# Patient Record
Sex: Male | Born: 1965
Health system: Southern US, Community
[De-identification: ages and names within clinical notes are randomized; demographics above are authoritative.]

## PROBLEM LIST (undated history)

## (undated) DIAGNOSIS — K219 Gastro-esophageal reflux disease without esophagitis: Secondary | ICD-10-CM

## (undated) DIAGNOSIS — M1990 Unspecified osteoarthritis, unspecified site: Secondary | ICD-10-CM

## (undated) DIAGNOSIS — S8990XA Unspecified injury of unspecified lower leg, initial encounter: Secondary | ICD-10-CM

## (undated) HISTORY — PX: KNEE ARTHROSCOPY: SUR90

## (undated) HISTORY — DX: Unspecified injury of unspecified lower leg, initial encounter: S89.90XA

## (undated) HISTORY — PX: ELBOW SURGERY: SHX618

## (undated) HISTORY — PX: OTHER SURGICAL HISTORY: SHX169

## (undated) HISTORY — PX: KNEE SURGERY: SHX244

---

## 2017-09-02 LAB — COLOGUARD: Cologuard: NEGATIVE

## 2020-07-29 ENCOUNTER — Encounter (HOSPITAL_BASED_OUTPATIENT_CLINIC_OR_DEPARTMENT_OTHER): Payer: Self-pay

## 2020-07-29 ENCOUNTER — Emergency Department (HOSPITAL_BASED_OUTPATIENT_CLINIC_OR_DEPARTMENT_OTHER): Payer: BC Managed Care – PPO | Admitting: Radiology

## 2020-07-29 ENCOUNTER — Other Ambulatory Visit: Payer: Self-pay

## 2020-07-29 ENCOUNTER — Emergency Department (HOSPITAL_BASED_OUTPATIENT_CLINIC_OR_DEPARTMENT_OTHER)
Admission: EM | Admit: 2020-07-29 | Discharge: 2020-07-29 | Disposition: A | Discharge status: Home / Self Care | Payer: BC Managed Care – PPO | Attending: Emergency Medicine | Admitting: Emergency Medicine

## 2020-07-29 DIAGNOSIS — M1711 Unilateral primary osteoarthritis, right knee: Secondary | ICD-10-CM | POA: Diagnosis not present

## 2020-07-29 DIAGNOSIS — M25561 Pain in right knee: Secondary | ICD-10-CM | POA: Diagnosis not present

## 2020-07-29 HISTORY — DX: Unspecified osteoarthritis, unspecified site: M19.90

## 2020-07-29 IMAGING — DX DG KNEE COMPLETE 4+V*R*
1 series · 4 of 4 positions shown · non-contrast
Comparison: None.

CLINICAL DATA: Right medial knee pain for 1.5 months.

EXAM:
RIGHT KNEE - COMPLETE 4+ VIEW

[Series 1: knee · 0.14mm/px · 4 of 4 slices shown]
[im 1/4]
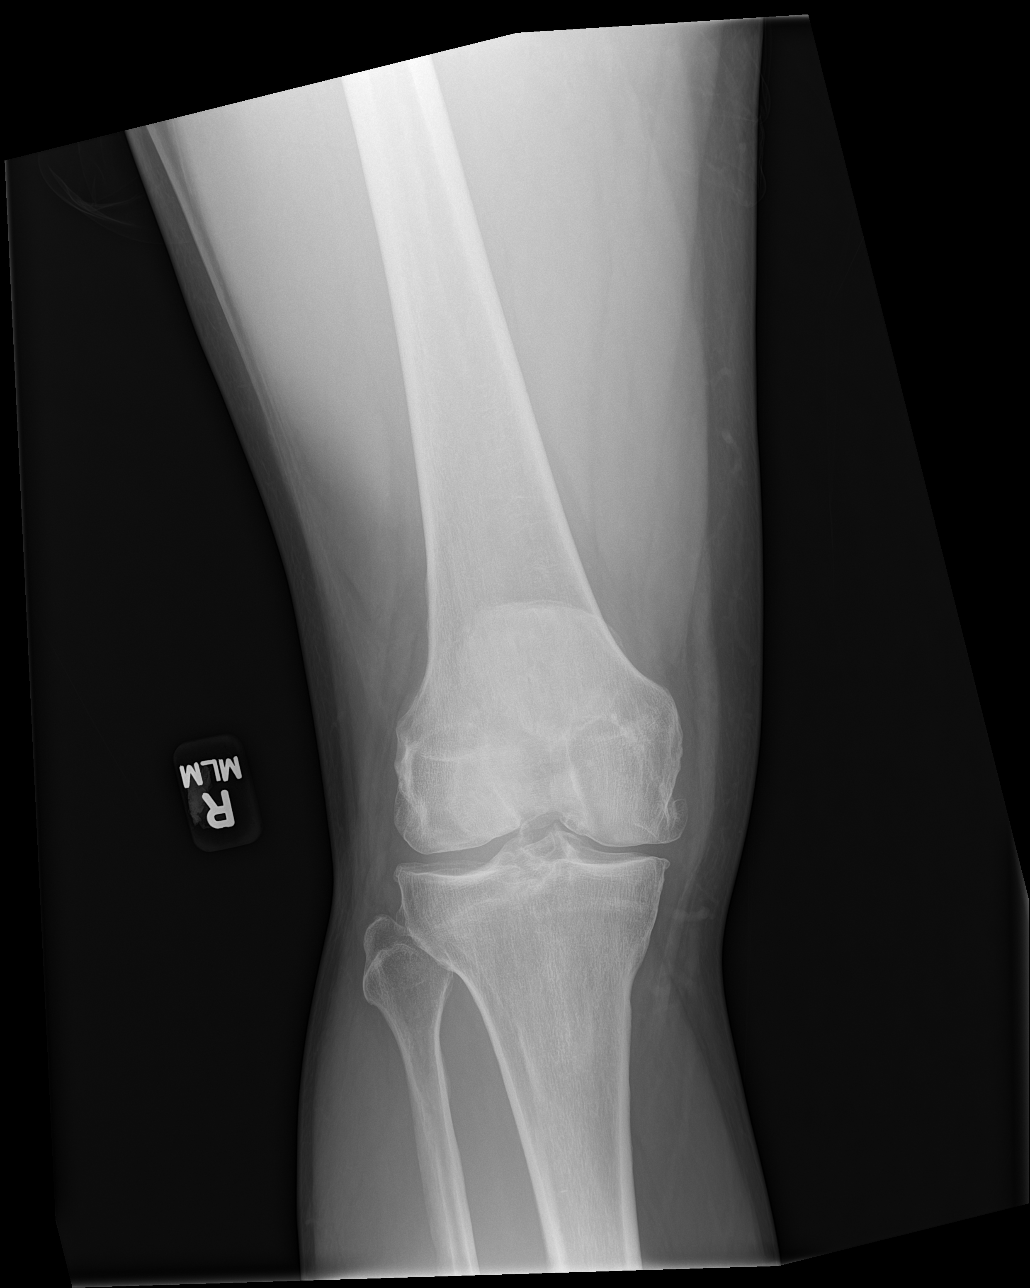
[im 2/4]
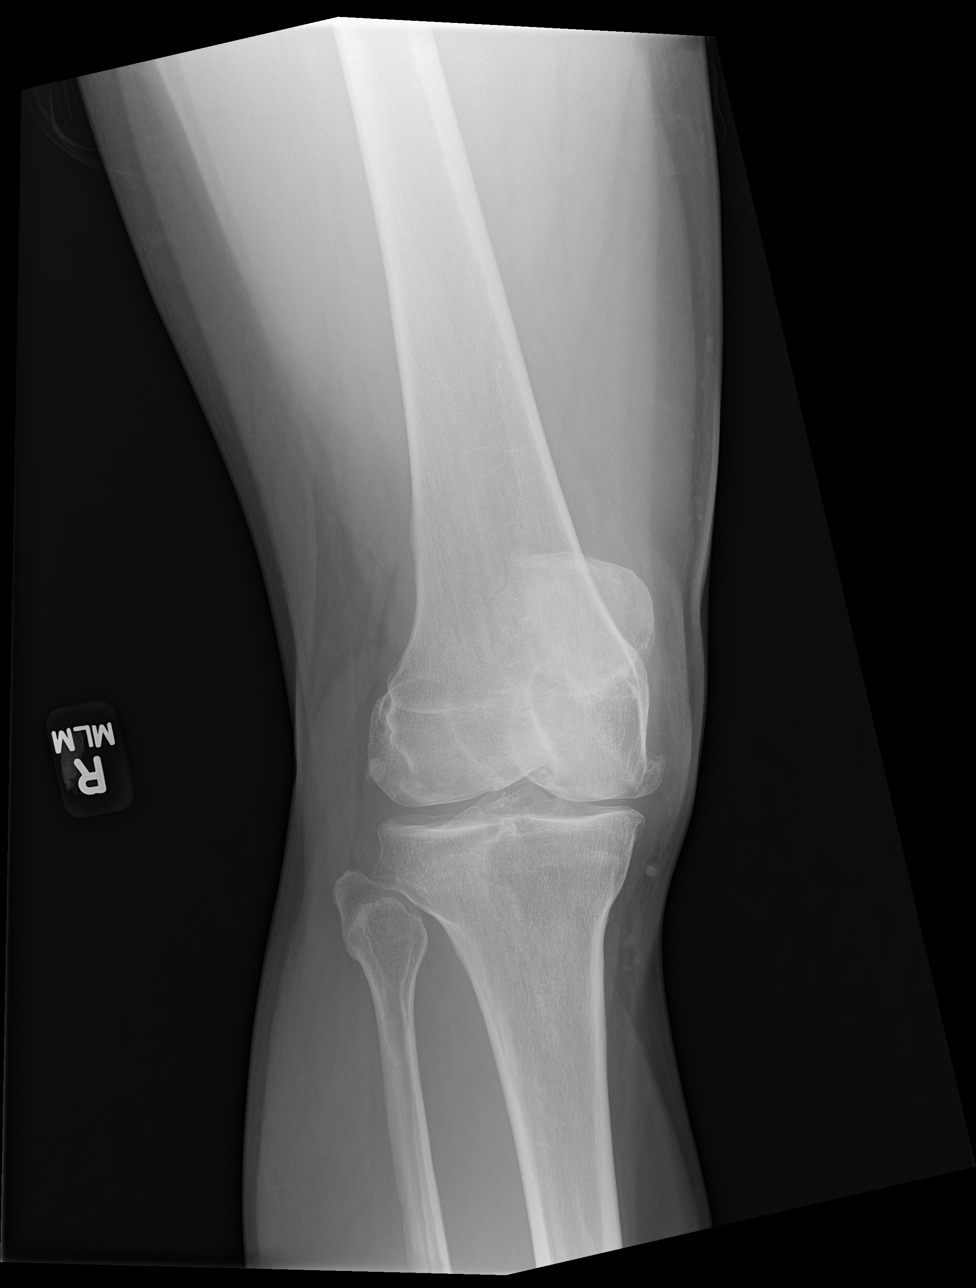
[im 3/4]
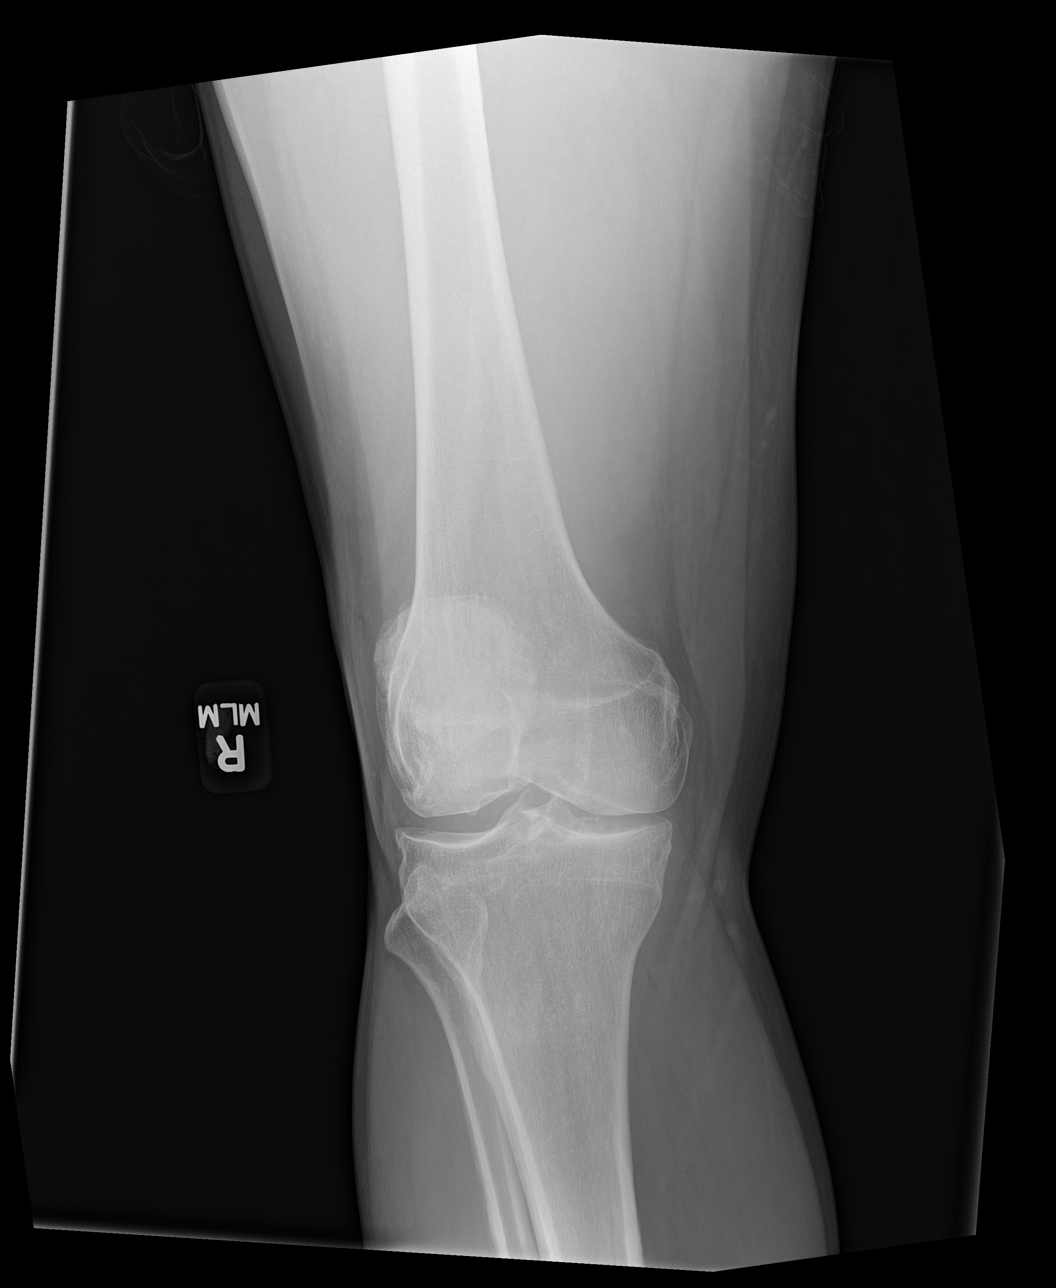
[im 4/4]
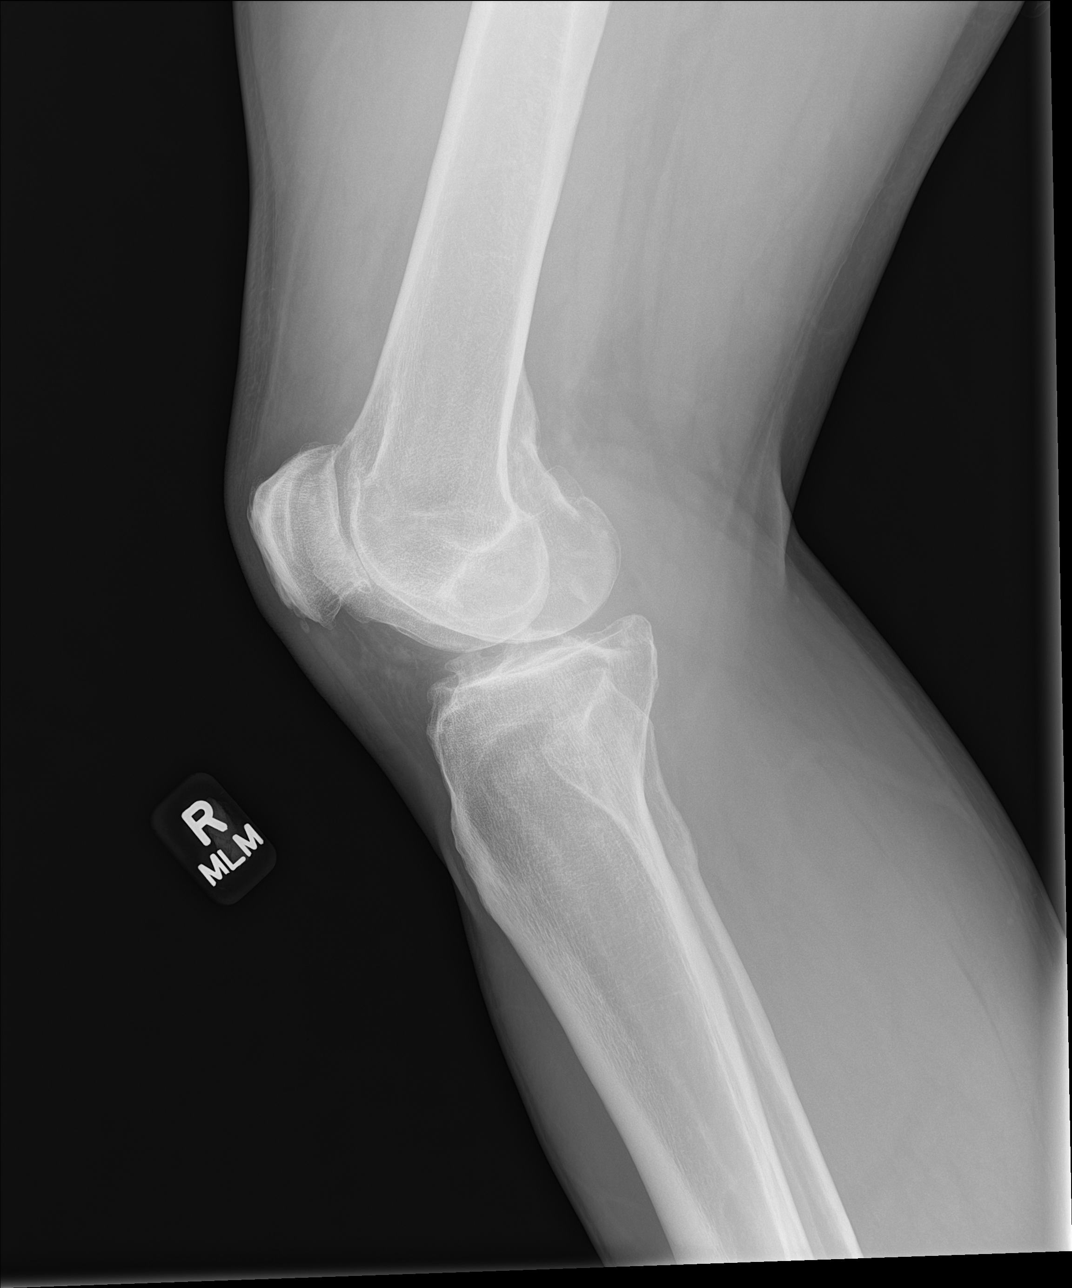

[4 of 4 positions shown; findings below may reference images not displayed]

FINDINGS: Degenerative marginal spurring especially at the medial compartment.
Patellofemoral compartment narrowing is likely, although no sunrise
view. Medial compartment narrowing on the frontal view. No acute
fracture or subluxation. No evidence of erosion. No convincing joint
effusion
IMPRESSION: Tricompartmental osteoarthritis with probable medial and
patellofemoral compartment narrowing.

## 2020-07-29 MED ORDER — KETOROLAC TROMETHAMINE 30 MG/ML IJ SOLN
30.0000 mg | Freq: Once | INTRAMUSCULAR | Status: AC
  Administered 2020-07-29: 30 mg via INTRAMUSCULAR
  Filled 2020-07-29: qty 1

## 2020-07-29 MED ORDER — CELECOXIB 200 MG PO CAPS: 200.0000 mg | ORAL_CAPSULE | Freq: Two times a day (BID) | ORAL | 0 refills | Status: DC

## 2020-07-29 MED ORDER — MELOXICAM 15 MG PO TABS: 15.0000 mg | ORAL_TABLET | Freq: Every day | ORAL | 0 refills | Status: DC

## 2020-07-29 MED ORDER — DEXAMETHASONE SODIUM PHOSPHATE 10 MG/ML IJ SOLN
10.0000 mg | Freq: Once | INTRAMUSCULAR | Status: AC
  Administered 2020-07-29: 10 mg via INTRAMUSCULAR
  Filled 2020-07-29: qty 1

## 2020-07-29 MED ORDER — PREDNISONE 10 MG (21) PO TBPK: ORAL_TABLET | Freq: Every day | ORAL | 0 refills | Status: DC

## 2020-07-29 NOTE — ED Notes (Signed)
ED Provider at bedside. 

## 2020-07-29 NOTE — ED Notes (Signed)
X-ray at bedside

## 2020-07-29 NOTE — ED Triage Notes (Signed)
Pt reports right medial knee pain for approximately 1 1/2 months.  Denies any injury.  Has tried rest, ice, elevation and anti-inflammatory medications without significant relief.

## 2020-07-29 NOTE — ED Provider Notes (Signed)
[facility] Provider Note   CSN: [id number] Arrival date & time: 07/29/20  3149     History Chief Complaint  Patient presents with   Knee Pain    Thomas Small is a 55 y.o. male.  Pt presents to the ED today with right knee pain.  Pt said he has had right knee pain for about 6 weeks.  No known injury.  He said it is getting worse instead of better.  No f/c.  He has been trying ice, rest, nsaids without improvement in sx.        Past Medical History:  Diagnosis Date   Arthritis     There are no problems to display for this patient.   Past Surgical History:  Procedure Laterality Date   KNEE ARTHROSCOPY Bilateral    thumb surgery Right        No family history on file.  Social History   Tobacco Use   Smoking status: Never  Vaping Use   Vaping Use: Never used  Substance Use Topics   Alcohol use: Yes   Drug use: Never    Home Medications Prior to Admission medications   Medication Sig Start Date End Date Taking? Authorizing Provider  celecoxib (CELEBREX) 200 MG capsule Take 1 capsule (200 mg total) by mouth 2 (two) times daily. 07/29/20  Yes Isla Pence, MD  predniSONE (STERAPRED UNI-PAK 21 TAB) 10 MG (21) TBPK tablet Take by mouth daily. Take 6 tabs by mouth daily  for 2 days, then 5 tabs for 2 days, then 4 tabs for 2 days, then 3 tabs for 2 days, 2 tabs for 2 days, then 1 tab by mouth daily for 2 days 07/29/20  Yes Isla Pence, MD    Allergies    Patient has no known allergies.  Review of Systems   Review of Systems  Musculoskeletal:        Right knee pain  All other systems reviewed and are negative.  Physical Exam Updated Vital Signs BP (!) 153/99   Pulse (!) 55   Temp 98.4 F (36.9 C) (Oral)   Resp 16   Ht 6\' 3"  (1.905 m)   Wt 113.4 kg   SpO2 98%   BMI 31.25 kg/m   Physical Exam Vitals and nursing note reviewed.  Constitutional:      Appearance: Normal appearance.  HENT:     Head: Normocephalic and  atraumatic.     Right Ear: External ear normal.     Left Ear: External ear normal.     Nose: Nose normal.     Mouth/Throat:     Mouth: Mucous membranes are moist.     Pharynx: Oropharynx is clear.  Eyes:     Extraocular Movements: Extraocular movements intact.     Conjunctiva/sclera: Conjunctivae normal.     Pupils: Pupils are equal, round, and reactive to light.  Cardiovascular:     Rate and Rhythm: Normal rate and regular rhythm.     Pulses: Normal pulses.     Heart sounds: Normal heart sounds.  Pulmonary:     Effort: Pulmonary effort is normal.     Breath sounds: Normal breath sounds.  Abdominal:     General: Abdomen is flat. Bowel sounds are normal.     Palpations: Abdomen is soft.  Musculoskeletal:     Cervical back: Normal range of motion and neck supple.     Right knee: Crepitus present. Tenderness present. Normal pulse.     Instability Tests: Anterior  drawer test negative. Posterior drawer test negative. Anterior Lachman test negative. Medial McMurray test positive. Lateral McMurray test negative.  Skin:    General: Skin is warm.     Capillary Refill: Capillary refill takes less than 2 seconds.  Neurological:     General: No focal deficit present.     Mental Status: He is alert and oriented to person, place, and time.  Psychiatric:        Mood and Affect: Mood normal.        Behavior: Behavior normal.    ED Results / Procedures / Treatments   Labs (all labs ordered are listed, but only abnormal results are displayed) Labs Reviewed - No data to display  EKG None  Radiology DG Knee Complete 4 Views Right  Result Date: 07/29/2020 CLINICAL DATA:  Right medial knee pain for 1.5 months. EXAM: RIGHT KNEE - COMPLETE 4+ VIEW COMPARISON:  None. FINDINGS: Degenerative marginal spurring especially at the medial compartment. Patellofemoral compartment narrowing is likely, although no sunrise view. Medial compartment narrowing on the frontal view. No acute fracture or  subluxation. No evidence of erosion. No convincing joint effusion IMPRESSION: Tricompartmental osteoarthritis with probable medial and patellofemoral compartment narrowing. Electronically Signed   By: Monte Fantasia M.D.   On: 07/29/2020 09:19    Procedures Procedures   Medications Ordered in ED Medications  ketorolac (TORADOL) 30 MG/ML injection 30 mg (30 mg Intramuscular Given 07/29/20 0859)  dexamethasone (DECADRON) injection 10 mg (10 mg Intramuscular Given 07/29/20 8242)    ED Course  I have reviewed the triage vital signs and the nursing notes.  Pertinent labs & imaging results that were available during my care of the patient were reviewed by me and considered in my medical decision making (see chart for details).    MDM Rules/Calculators/A&P                          Pt with arthritis of knee.  Medial is worse than lateral.  Pt is to f/u with ortho.  Return if worse. Final Clinical Impression(s) / ED Diagnoses Final diagnoses:  Osteoarthritis of right knee, unspecified osteoarthritis type    Rx / DC Orders ED Discharge Orders          Ordered    predniSONE (STERAPRED UNI-PAK 21 TAB) 10 MG (21) TBPK tablet  Daily        07/29/20 0910    celecoxib (CELEBREX) 200 MG capsule  2 times daily        07/29/20 0929             Isla Pence, MD 07/29/20 0930

## 2020-08-06 DIAGNOSIS — M1711 Unilateral primary osteoarthritis, right knee: Secondary | ICD-10-CM | POA: Diagnosis not present

## 2020-10-24 ENCOUNTER — Other Ambulatory Visit: Payer: Self-pay

## 2020-10-24 ENCOUNTER — Encounter: Payer: Self-pay | Admitting: Internal Medicine

## 2020-10-24 ENCOUNTER — Ambulatory Visit: Payer: BC Managed Care – PPO | Admitting: Internal Medicine

## 2020-10-24 VITALS — BP 142/92 | HR 70 | Temp 97.9°F | Resp 16 | Ht 75.0 in | Wt 251.0 lb

## 2020-10-24 DIAGNOSIS — R001 Bradycardia, unspecified: Secondary | ICD-10-CM

## 2020-10-24 DIAGNOSIS — Z125 Encounter for screening for malignant neoplasm of prostate: Secondary | ICD-10-CM | POA: Diagnosis not present

## 2020-10-24 DIAGNOSIS — R030 Elevated blood-pressure reading, without diagnosis of hypertension: Secondary | ICD-10-CM

## 2020-10-24 DIAGNOSIS — Z0001 Encounter for general adult medical examination with abnormal findings: Secondary | ICD-10-CM

## 2020-10-24 DIAGNOSIS — K219 Gastro-esophageal reflux disease without esophagitis: Secondary | ICD-10-CM | POA: Diagnosis not present

## 2020-10-24 DIAGNOSIS — Z1211 Encounter for screening for malignant neoplasm of colon: Secondary | ICD-10-CM | POA: Insufficient documentation

## 2020-10-24 DIAGNOSIS — D2230 Melanocytic nevi of unspecified part of face: Secondary | ICD-10-CM

## 2020-10-24 DIAGNOSIS — Z1159 Encounter for screening for other viral diseases: Secondary | ICD-10-CM | POA: Insufficient documentation

## 2020-10-24 LAB — LDL CHOLESTEROL, DIRECT: Direct LDL: 137 mg/dL

## 2020-10-24 LAB — CBC WITH DIFFERENTIAL/PLATELET
Basophils Absolute: 0 10*3/uL (ref 0.0–0.1)
Basophils Relative: 0.8 % (ref 0.0–3.0)
Eosinophils Absolute: 0 10*3/uL (ref 0.0–0.7)
Eosinophils Relative: 0.8 % (ref 0.0–5.0)
HCT: 42.3 % (ref 39.0–52.0)
Hemoglobin: 14.2 g/dL (ref 13.0–17.0)
Lymphocytes Relative: 33.5 % (ref 12.0–46.0)
Lymphs Abs: 1.6 10*3/uL (ref 0.7–4.0)
MCHC: 33.5 g/dL (ref 30.0–36.0)
MCV: 87.6 fl (ref 78.0–100.0)
Monocytes Absolute: 0.4 10*3/uL (ref 0.1–1.0)
Monocytes Relative: 7.6 % (ref 3.0–12.0)
Neutro Abs: 2.7 10*3/uL (ref 1.4–7.7)
Neutrophils Relative %: 57.3 % (ref 43.0–77.0)
Platelets: 185 10*3/uL (ref 150.0–400.0)
RBC: 4.83 Mil/uL (ref 4.22–5.81)
RDW: 14.3 % (ref 11.5–15.5)
WBC: 4.7 10*3/uL (ref 4.0–10.5)

## 2020-10-24 LAB — URINALYSIS, ROUTINE W REFLEX MICROSCOPIC
Bilirubin Urine: NEGATIVE
Hgb urine dipstick: NEGATIVE
Ketones, ur: NEGATIVE
Leukocytes,Ua: NEGATIVE
Nitrite: NEGATIVE
RBC / HPF: NONE SEEN (ref 0–?)
Specific Gravity, Urine: >=1.03 — AB (ref 1.000–1.030)
Total Protein, Urine: NEGATIVE
Urine Glucose: NEGATIVE
Urobilinogen, UA: 0.2 (ref 0.0–1.0)
pH: 5.5 (ref 5.0–8.0)

## 2020-10-24 LAB — TSH: TSH: 2.23 u[IU]/mL (ref 0.35–5.50)

## 2020-10-24 LAB — LIPID PANEL
Cholesterol: 242 mg/dL — ABNORMAL HIGH (ref 0–200)
HDL: 33.2 mg/dL — ABNORMAL LOW (ref >39.00)
NonHDL: 208.64
Total CHOL/HDL Ratio: 7
Triglycerides: 276 mg/dL — ABNORMAL HIGH (ref 0.0–149.0)
VLDL: 55.2 mg/dL — ABNORMAL HIGH (ref 0.0–40.0)

## 2020-10-24 LAB — BASIC METABOLIC PANEL
BUN: 14 mg/dL (ref 6–23)
CO2: 29 mEq/L (ref 19–32)
Calcium: 9.7 mg/dL (ref 8.4–10.5)
Chloride: 103 mEq/L (ref 96–112)
Creatinine, Ser: 1.22 mg/dL (ref 0.40–1.50)
GFR: 67.52 mL/min (ref >60.00)
Glucose, Bld: 99 mg/dL (ref 70–99)
Potassium: 4.3 mEq/L (ref 3.5–5.1)
Sodium: 140 mEq/L (ref 135–145)

## 2020-10-24 LAB — HEPATIC FUNCTION PANEL
ALT: 19 U/L (ref 0–53)
AST: 20 U/L (ref 0–37)
Albumin: 4.7 g/dL (ref 3.5–5.2)
Alkaline Phosphatase: 44 U/L (ref 39–117)
Bilirubin, Direct: 0.1 mg/dL (ref 0.0–0.3)
Total Bilirubin: 0.8 mg/dL (ref 0.2–1.2)
Total Protein: 7.4 g/dL (ref 6.0–8.3)

## 2020-10-24 LAB — PSA: PSA: 0.53 ng/mL (ref 0.10–4.00)

## 2020-10-24 NOTE — Patient Instructions (Signed)
Health Maintenance, Male Adopting a healthy lifestyle and getting preventive care are important in promoting health and wellness. Ask your health care provider about: The right schedule for you to have regular tests and exams. Things you can do on your own to prevent diseases and keep yourself healthy. What should I know about diet, weight, and exercise? Eat a healthy diet  Eat a diet that includes plenty of vegetables, fruits, low-fat dairy products, and lean protein. Do not eat a lot of foods that are high in solid fats, added sugars, or sodium. Maintain a healthy weight Body mass index (BMI) is a measurement that can be used to identify possible weight problems. It estimates body fat based on height and weight. Your health care provider can help determine your BMI and help you achieve or maintain a healthy weight. Get regular exercise Get regular exercise. This is one of the most important things you can do for your health. Most adults should: Exercise for at least 150 minutes each week. The exercise should increase your heart rate and make you sweat (moderate-intensity exercise). Do strengthening exercises at least twice a week. This is in addition to the moderate-intensity exercise. Spend less time sitting. Even light physical activity can be beneficial. Watch cholesterol and blood lipids Have your blood tested for lipids and cholesterol at 54 years of age, then have this test every 5 years. You may need to have your cholesterol levels checked more often if: Your lipid or cholesterol levels are high. You are older than 54 years of age. You are at high risk for heart disease. What should I know about cancer screening? Many types of cancers can be detected early and may often be prevented. Depending on your health history and family history, you may need to have cancer screening at various ages. This may include screening for: Colorectal cancer. Prostate cancer. Skin cancer. Lung  cancer. What should I know about heart disease, diabetes, and high blood pressure? Blood pressure and heart disease High blood pressure causes heart disease and increases the risk of stroke. This is more likely to develop in people who have high blood pressure readings, are of African descent, or are overweight. Talk with your health care provider about your target blood pressure readings. Have your blood pressure checked: Every 3-5 years if you are 18-39 years of age. Every year if you are 40 years old or older. If you are between the ages of 65 and 75 and are a current or former smoker, ask your health care provider if you should have a one-time screening for abdominal aortic aneurysm (AAA). Diabetes Have regular diabetes screenings. This checks your fasting blood sugar level. Have the screening done: Once every three years after age 45 if you are at a normal weight and have a low risk for diabetes. More often and at a younger age if you are overweight or have a high risk for diabetes. What should I know about preventing infection? Hepatitis B If you have a higher risk for hepatitis B, you should be screened for this virus. Talk with your health care provider to find out if you are at risk for hepatitis B infection. Hepatitis C Blood testing is recommended for: Everyone born from 1945 through 1965. Anyone with known risk factors for hepatitis C. Sexually transmitted infections (STIs) You should be screened each year for STIs, including gonorrhea and chlamydia, if: You are sexually active and are younger than 54 years of age. You are older than 54 years   of age and your health care provider tells you that you are at risk for this type of infection. Your sexual activity has changed since you were last screened, and you are at increased risk for chlamydia or gonorrhea. Ask your health care provider if you are at risk. Ask your health care provider about whether you are at high risk for HIV.  Your health care provider may recommend a prescription medicine to help prevent HIV infection. If you choose to take medicine to prevent HIV, you should first get tested for HIV. You should then be tested every 3 months for as long as you are taking the medicine. Follow these instructions at home: Lifestyle Do not use any products that contain nicotine or tobacco, such as cigarettes, e-cigarettes, and chewing tobacco. If you need help quitting, ask your health care provider. Do not use street drugs. Do not share needles. Ask your health care provider for help if you need support or information about quitting drugs. Alcohol use Do not drink alcohol if your health care provider tells you not to drink. If you drink alcohol: Limit how much you have to 0-2 drinks a day. Be aware of how much alcohol is in your drink. In the [place] one drink equals one 12 oz bottle of beer (355 mL), one 5 oz glass of wine (148 mL), or one 1 oz glass of hard liquor (44 mL). General instructions Schedule regular health, dental, and eye exams. Stay current with your vaccines. Tell your health care provider if: You often feel depressed. You have ever been abused or do not feel safe at home. Summary Adopting a healthy lifestyle and getting preventive care are important in promoting health and wellness. Follow your health care provider's instructions about healthy diet, exercising, and getting tested or screened for diseases. Follow your health care provider's instructions on monitoring your cholesterol and blood pressure. This information is not intended to replace advice given to you by your health care provider. Make sure you discuss any questions you have with your health care provider. Document Revised: 03/29/2020 Document Reviewed: 01/12/2018 Elsevier Patient Education  2022 [employer]vier Inc.  

## 2020-10-24 NOTE — Progress Notes (Signed)
blood  Subjective:  Patient ID: Thomas Small, male    DOB: 12/10/1965  Age: 55 y.o. MRN: [id number]  CC: Annual Exam  This visit occurred during the SARS-CoV-2 public health emergency.  Safety protocols were in place, including screening questions prior to the visit, additional usage of staff PPE, and extensive cleaning of exam room while observing appropriate contact time as indicated for disinfecting solutions.    HPI Thomas Small presents for a CPX and to establish.  He has a hx of sinus bradycardia. He is active and denies dizziness, lightheadedness, palpitations, near syncope, or edema.  History Thomas Small has a past medical history of Acute cartilage injury of knee and Arthritis.   He has a past surgical history that includes Knee arthroscopy (Bilateral); thumb surgery (Right); Knee surgery (Bilateral); Elbow surgery (Left); and right thumb surgery.   His family history includes Asthma in his mother; Cancer in his father; Diabetes in his mother; Hypertension in his mother.He reports that he has never smoked. He has never used smokeless tobacco. He reports that he does not use drugs. No history on file for alcohol use.  Outpatient Medications Prior to Visit  Medication Sig Dispense Refill   Esomeprazole Magnesium (NEXIUM PO) Take by mouth.     meloxicam (MOBIC) 15 MG tablet Take 1 tablet (15 mg total) by mouth daily. 30 tablet 0   predniSONE (STERAPRED UNI-PAK 21 TAB) 10 MG (21) TBPK tablet Take by mouth daily. Take 6 tabs by mouth daily  for 2 days, then 5 tabs for 2 days, then 4 tabs for 2 days, then 3 tabs for 2 days, 2 tabs for 2 days, then 1 tab by mouth daily for 2 days 42 tablet 0   No facility-administered medications prior to visit.    ROS Review of Systems  Constitutional:  Negative for diaphoresis and fatigue.  HENT: Negative.    Eyes: Negative.   Respiratory:  Negative for cough, chest tightness, shortness of breath and wheezing.   Cardiovascular:  Negative for chest  pain, palpitations and leg swelling.  Gastrointestinal:  Negative for abdominal pain, blood in stool, constipation, diarrhea, nausea and vomiting.  Endocrine: Negative.   Genitourinary: Negative.  Negative for difficulty urinating, flank pain, hematuria, scrotal swelling and testicular pain.  Musculoskeletal:  Positive for arthralgias (chr knee pain). Negative for myalgias.  Skin: Negative.  Negative for color change and pallor.  Neurological:  Negative for dizziness, weakness, light-headedness and numbness.  Hematological:  Negative for adenopathy. Does not bruise/bleed easily.  Psychiatric/Behavioral: Negative.     Objective:  BP (!) 142/92 (BP Location: Right Arm, Patient Position: Sitting, Cuff Size: Large)   Pulse 70   Temp 97.9 F (36.6 C) (Oral)   Resp 16   Ht 6\' 3"  (1.905 m)   Wt 251 lb (113.9 kg)   SpO2 96%   BMI 31.37 kg/m   Physical Exam Vitals reviewed.  HENT:     Head:      Nose: Nose normal.     Mouth/Throat:     Mouth: Mucous membranes are moist.  Eyes:     General: No scleral icterus.    Conjunctiva/sclera: Conjunctivae normal.  Cardiovascular:     Rate and Rhythm: Regular rhythm. Bradycardia present.     Heart sounds: No murmur heard.   No friction rub. No gallop.     Comments: EKG- Sinus bradycardia, 52 bpm No LVH or Q waves Pulmonary:     Effort: Pulmonary effort is normal.     Breath  sounds: No stridor. No wheezing, rhonchi or rales.  Abdominal:     General: Abdomen is flat.     Palpations: There is no mass.     Tenderness: There is no abdominal tenderness. There is no guarding.     Hernia: No hernia is present. There is no hernia in the left inguinal area or right inguinal area.  Genitourinary:    Pubic Area: No rash.      Penis: Normal and circumcised.      Testes: Normal.     Epididymis:     Right: Normal.     Left: Normal.     Prostate: Normal. Not enlarged, not tender and no nodules present.     Rectum: Normal. Guaiac result negative.  No mass, tenderness, anal fissure, external hemorrhoid or internal hemorrhoid. Normal anal tone.  Musculoskeletal:        General: Normal range of motion.     Cervical back: Neck supple.     Right lower leg: No edema.     Left lower leg: No edema.  Lymphadenopathy:     Cervical: No cervical adenopathy.     Lower Body: No right inguinal adenopathy. No left inguinal adenopathy.  Skin:    General: Skin is warm and dry.     Findings: Lesion present. No rash.  Neurological:     General: No focal deficit present.     Mental Status: He is alert.  Psychiatric:        Mood and Affect: Mood normal.        Behavior: Behavior normal.    Lab Results  Component Value Date   WBC 4.7 10/24/2020   HGB 14.2 10/24/2020   HCT 42.3 10/24/2020   PLT 185.0 10/24/2020   GLUCOSE 99 10/24/2020   CHOL 242 (H) 10/24/2020   TRIG 276.0 (H) 10/24/2020   HDL 33.20 (L) 10/24/2020   LDLDIRECT 137.0 10/24/2020   ALT 19 10/24/2020   AST 20 10/24/2020   NA 140 10/24/2020   K 4.3 10/24/2020   CL 103 10/24/2020   CREATININE 1.22 10/24/2020   BUN 14 10/24/2020   CO2 29 10/24/2020   TSH 2.23 10/24/2020   PSA 0.53 10/24/2020     Assessment & Plan:   Thomas Small was seen today for annual exam.  Diagnoses and all orders for this visit:  Blood pressure elevated without history of HTN -  Will screen for secondary causes and end organ damage. He does not want to take an antihypertensive. Will recheck BP in 3 months. -     CBC with Differential/Platelet; Future -     Basic metabolic panel; Future -     TSH; Future -     Urinalysis, Routine w reflex microscopic; Future -     Hepatic function panel; Future -     EKG 12-Lead -     Aldosterone + renin activity w/ ratio; Future -     Aldosterone + renin activity w/ ratio -     Hepatic function panel -     Urinalysis, Routine w reflex microscopic -     TSH -     Basic metabolic panel -     CBC with Differential/Platelet  Encounter for general adult medical  examination with abnormal findings- Exam completed, labs reviewed, he refused a flu vaccine, cancer screenings addressed, pt ed material was given, -     Lipid panel; Future -     PSA; Future -     Hepatitis C antibody;  Future -     HIV Antibody (routine testing w rflx); Future -     HIV Antibody (routine testing w rflx) -     Hepatitis C antibody -     PSA -     Lipid panel  Need for hepatitis C screening test -     Hepatitis C antibody; Future -     Hepatitis C antibody  Colon cancer screening -     Cologuard  Chronic sinus bradycardia- He is asx with this. His TSH is normal.  Atypical nevus of face -     Ambulatory referral to Dermatology  Other orders -     LDL cholesterol, direct  I have discontinued Thomas Europe "Bob"'s predniSONE and meloxicam. I am also having him maintain his Esomeprazole Magnesium (NEXIUM PO).  No orders of the defined types were placed in this encounter.    Follow-up: Return in about 3 months (around 01/23/2021).  Thomas Calico, MD

## 2020-11-02 ENCOUNTER — Encounter: Payer: Self-pay | Admitting: Internal Medicine

## 2020-11-02 LAB — HEPATITIS C ANTIBODY
Hepatitis C Ab: NONREACTIVE
SIGNAL TO CUT-OFF: 0.01 (ref <1.00)

## 2020-11-02 LAB — ALDOSTERONE + RENIN ACTIVITY W/ RATIO
ALDO / PRA Ratio: 3.3 Ratio (ref 0.9–28.9)
Aldosterone: 5 ng/dL
Renin Activity: 1.51 ng/mL/h (ref 0.25–5.82)

## 2020-11-02 LAB — HIV ANTIBODY (ROUTINE TESTING W REFLEX): HIV 1&2 Ab, 4th Generation: NONREACTIVE

## 2020-11-04 ENCOUNTER — Encounter: Payer: Self-pay | Admitting: Internal Medicine

## 2020-12-04 ENCOUNTER — Encounter: Payer: Self-pay | Admitting: Emergency Medicine

## 2020-12-04 ENCOUNTER — Ambulatory Visit
Admission: EM | Admit: 2020-12-04 | Discharge: 2020-12-04 | Disposition: A | Discharge status: Home / Self Care | Payer: BC Managed Care – PPO | Attending: Urgent Care | Admitting: Urgent Care

## 2020-12-04 ENCOUNTER — Telehealth: Payer: Self-pay | Admitting: Internal Medicine

## 2020-12-04 ENCOUNTER — Other Ambulatory Visit: Payer: Self-pay

## 2020-12-04 DIAGNOSIS — R053 Chronic cough: Secondary | ICD-10-CM

## 2020-12-04 MED ORDER — PSEUDOEPHEDRINE HCL 60 MG PO TABS: 60.0000 mg | ORAL_TABLET | Freq: Three times a day (TID) | ORAL | 0 refills | Status: AC | PRN

## 2020-12-04 MED ORDER — PROMETHAZINE-DM 6.25-15 MG/5ML PO SYRP: 5.0000 mL | ORAL_SOLUTION | Freq: Every evening | ORAL | 0 refills | Status: AC | PRN

## 2020-12-04 MED ORDER — CETIRIZINE HCL 10 MG PO TABS: 10.0000 mg | ORAL_TABLET | Freq: Every day | ORAL | 0 refills | Status: AC

## 2020-12-04 MED ORDER — BENZONATATE 100 MG PO CAPS: 100.0000 mg | ORAL_CAPSULE | Freq: Three times a day (TID) | ORAL | 0 refills | Status: AC | PRN

## 2020-12-04 NOTE — Telephone Encounter (Signed)
Patient states he has flu like symptoms since 11-27-2020  There were no appts available in office or at any other [facility] locations for today  Patient states he will go to urgent care

## 2020-12-04 NOTE — ED Triage Notes (Signed)
Cough since Wednesday last week.  Has been taken over the counter medication with out relief.

## 2020-12-04 NOTE — ED Provider Notes (Signed)
[facility]   MRN: [id number] DOB: 10-14-1965  Subjective:   Thomas Small is a 55 y.o. male presenting for 1 week history of persistent coughing.  Patient started out with more symptoms but has improved considerably.  He has been using over-the-counter cough medications but his cough is persisting.  No history of asthma, smoking.  No chest pain, shortness of breath or wheezing.  Patient does have a history of high blood pressure but has not started medication as he is still monitoring with his primary care provider.  No current facility-administered medications for this encounter.  Current Outpatient Medications:    Esomeprazole Magnesium (NEXIUM PO), Take by mouth., Disp: , Rfl:    No Known Allergies  Past Medical History:  Diagnosis Date   Acute cartilage injury of knee    Arthritis      Past Surgical History:  Procedure Laterality Date   ELBOW SURGERY Left    KNEE ARTHROSCOPY Bilateral    KNEE SURGERY Bilateral    right thumb surgery     thumb surgery Right     Family History  Problem Relation Age of Onset   Diabetes Mother    Hypertension Mother    Asthma Mother    Cancer Father        bladder cancer    Social History   Tobacco Use   Smoking status: Never   Smokeless tobacco: Never  Vaping Use   Vaping Use: Never used  Substance Use Topics   Drug use: Never    ROS   Objective:   Vitals: BP (!) 150/83 (BP Location: Right Arm)   Pulse 63   Temp 98.2 F (36.8 C) (Oral)   Resp 18   SpO2 98%   Physical Exam Constitutional:      General: He is not in acute distress.    Appearance: Normal appearance. He is well-developed and normal weight. He is not ill-appearing, toxic-appearing or diaphoretic.  HENT:     Head: Normocephalic and atraumatic.     Right Ear: Tympanic membrane, ear canal and external ear normal. There is no impacted cerumen.     Left Ear: Tympanic membrane, ear canal and external ear normal. There is no impacted  cerumen.     Nose: Nose normal. No congestion or rhinorrhea.     Mouth/Throat:     Mouth: Mucous membranes are moist.     Pharynx: No oropharyngeal exudate or posterior oropharyngeal erythema.     Comments: Post-nasal drainage overlying the pharynx.  Eyes:     General: No scleral icterus.       Right eye: No discharge.        Left eye: No discharge.     Extraocular Movements: Extraocular movements intact.     Conjunctiva/sclera: Conjunctivae normal.     Pupils: Pupils are equal, round, and reactive to light.  Cardiovascular:     Rate and Rhythm: Normal rate and regular rhythm.     Heart sounds: Normal heart sounds. No murmur heard.   No friction rub. No gallop.  Pulmonary:     Effort: Pulmonary effort is normal. No respiratory distress.     Breath sounds: Normal breath sounds. No stridor. No wheezing, rhonchi or rales.  Musculoskeletal:     Cervical back: Normal range of motion and neck supple. No rigidity. No muscular tenderness.  Neurological:     General: No focal deficit present.     Mental Status: He is alert and oriented to person, place, and time.  Psychiatric:        Mood and Affect: Mood normal.        Behavior: Behavior normal.        Thought Content: Thought content normal.        Judgment: Judgment normal.    Assessment and Plan :   PDMP not reviewed this encounter.  1. Persistent cough    Offered patient a chest x-ray which she declined.  Also offered him an oral prednisone course but he prefers to hold off on this too.  Recommended supportive care. Counseled patient on potential for adverse effects with medications prescribed/recommended today, ER and return-to-clinic precautions discussed, patient verbalized understanding.    Jaynee Eagles, PA-C 12/04/20 1234

## 2020-12-05 ENCOUNTER — Telehealth: Payer: BC Managed Care – PPO | Admitting: Primary Care

## 2021-01-23 ENCOUNTER — Encounter: Payer: Self-pay | Admitting: Internal Medicine

## 2021-01-23 ENCOUNTER — Ambulatory Visit: Payer: BC Managed Care – PPO | Admitting: Internal Medicine

## 2021-01-23 ENCOUNTER — Other Ambulatory Visit: Payer: Self-pay

## 2021-01-23 VITALS — BP 136/88 | HR 66 | Temp 97.8°F | Resp 16 | Ht 75.0 in | Wt 250.0 lb

## 2021-01-23 DIAGNOSIS — R030 Elevated blood-pressure reading, without diagnosis of hypertension: Secondary | ICD-10-CM | POA: Diagnosis not present

## 2021-01-23 DIAGNOSIS — G8929 Other chronic pain: Secondary | ICD-10-CM | POA: Diagnosis not present

## 2021-01-23 DIAGNOSIS — M25511 Pain in right shoulder: Secondary | ICD-10-CM | POA: Diagnosis not present
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
## 2021-01-24 ENCOUNTER — Encounter: Payer: Self-pay | Admitting: Internal Medicine

## 2021-01-24 NOTE — Patient Instructions (Signed)

## 2021-02-04 ENCOUNTER — Other Ambulatory Visit (HOSPITAL_BASED_OUTPATIENT_CLINIC_OR_DEPARTMENT_OTHER): Payer: Self-pay | Admitting: Orthopaedic Surgery

## 2021-02-04 DIAGNOSIS — M25511 Pain in right shoulder: Secondary | ICD-10-CM

## 2021-02-06 ENCOUNTER — Ambulatory Visit (HOSPITAL_BASED_OUTPATIENT_CLINIC_OR_DEPARTMENT_OTHER)
Admission: RE | Admit: 2021-02-06 | Discharge: 2021-02-06 | Disposition: A | Discharge status: Home / Self Care | Payer: BC Managed Care – PPO | Source: Ambulatory Visit | Attending: Orthopaedic Surgery | Admitting: Orthopaedic Surgery

## 2021-02-06 ENCOUNTER — Ambulatory Visit (INDEPENDENT_AMBULATORY_CARE_PROVIDER_SITE_OTHER): Payer: BC Managed Care – PPO | Admitting: Orthopaedic Surgery

## 2021-02-06 ENCOUNTER — Other Ambulatory Visit: Payer: Self-pay

## 2021-02-06 DIAGNOSIS — M25561 Pain in right knee: Secondary | ICD-10-CM | POA: Diagnosis not present

## 2021-02-06 DIAGNOSIS — M25511 Pain in right shoulder: Secondary | ICD-10-CM | POA: Insufficient documentation
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
## 2021-02-26 DIAGNOSIS — M170 Bilateral primary osteoarthritis of knee: Secondary | ICD-10-CM | POA: Diagnosis not present

## 2021-03-03 ENCOUNTER — Other Ambulatory Visit: Payer: Self-pay

## 2021-03-03 ENCOUNTER — Ambulatory Visit
Admission: RE | Admit: 2021-03-03 | Discharge: 2021-03-03 | Disposition: A | Discharge status: Home / Self Care | Payer: Self-pay | Source: Ambulatory Visit | Attending: Orthopaedic Surgery | Admitting: Orthopaedic Surgery

## 2021-03-03 DIAGNOSIS — M25511 Pain in right shoulder: Secondary | ICD-10-CM

## 2021-03-03 IMAGING — MR MR SHOULDER*R* W/O CM
4 of 6 series · 18 of 40 positions shown · non-contrast
Comparison: X-ray shoulder [DATE].

CLINICAL DATA: Generalized right shoulder pain and weakness with
abduction of arm. Clinical concern for rotator cuff tear.

EXAM:
MRI OF THE RIGHT SHOULDER WITHOUT CONTRAST
TECHNIQUE: Multiplanar, multisequence MR imaging of the shoulder was performed.
No intravenous contrast was administered.

[Series 6: T2 fat-sat · axial · right · 3.0mm · 0.47mm/px · z∈[-57,+25]mm · 6 of 27 slices shown (1 of 3)]
[im 1/27]
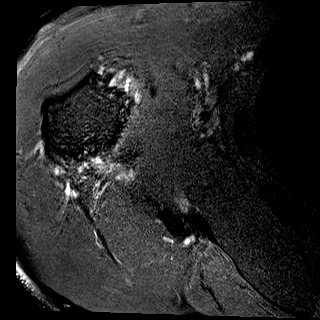
[im 5/27]
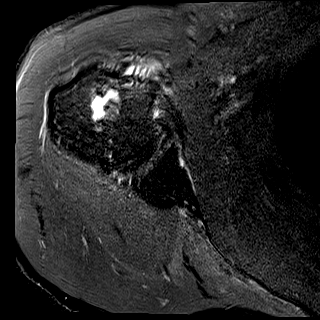
[im 9/27]
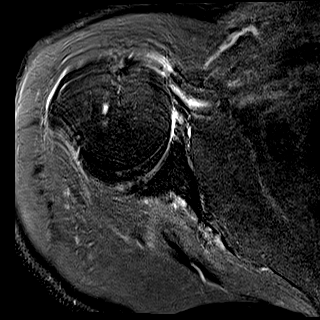
[im 14/27]
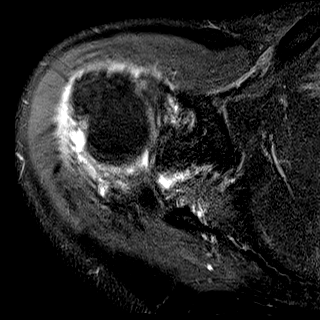
[im 18/27]
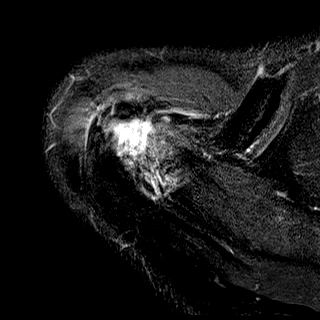
[im 22/27]
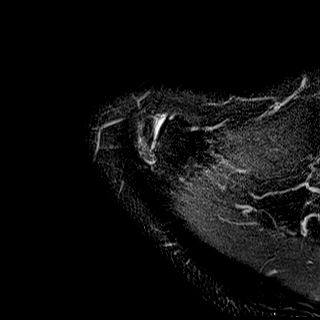

[Series 7: T2 fat-sat · oblique · right · 4.0mm · 0.22mm/px · 3 of 21 slices shown (2 of 3)]
[im 1/21]
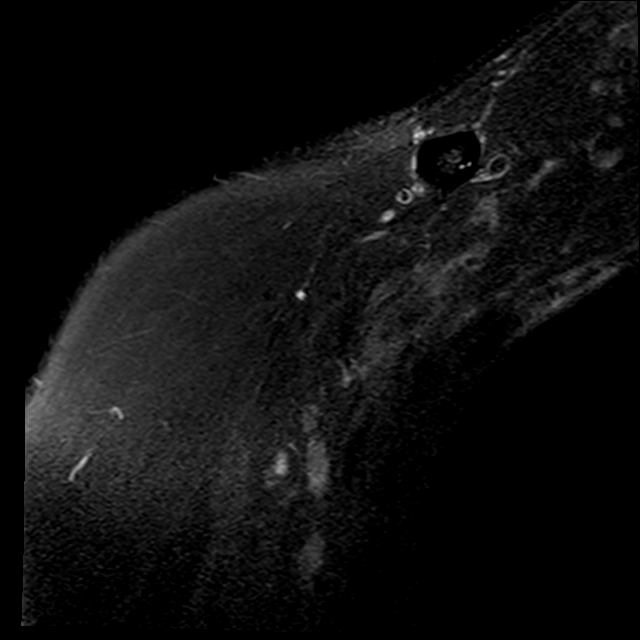
[im 11/21]
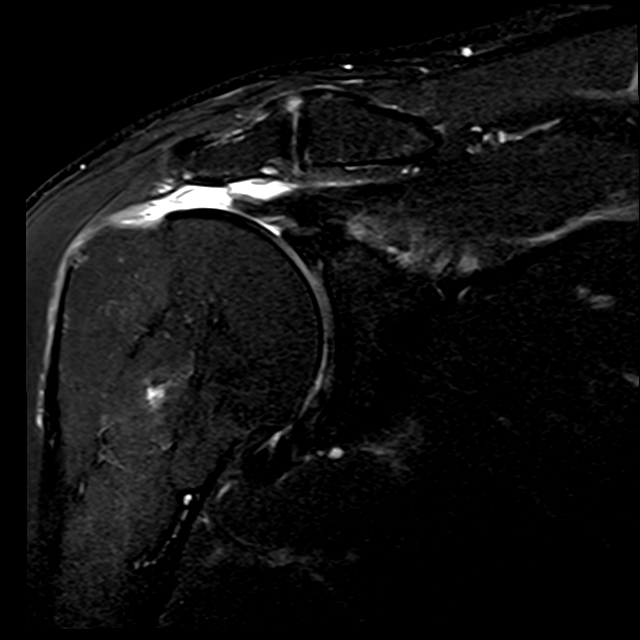
[im 21/21]
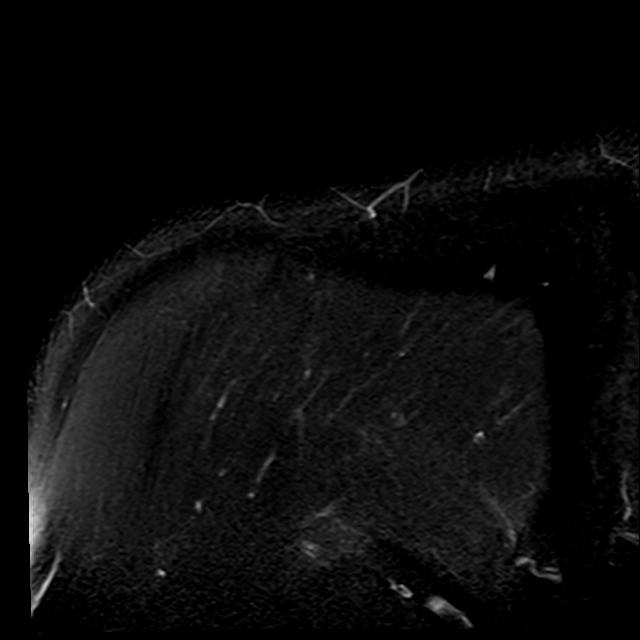

[Series 8: PD · oblique · right · 4.0mm · 0.22mm/px · 6 of 21 slices shown]
[im 1/21]
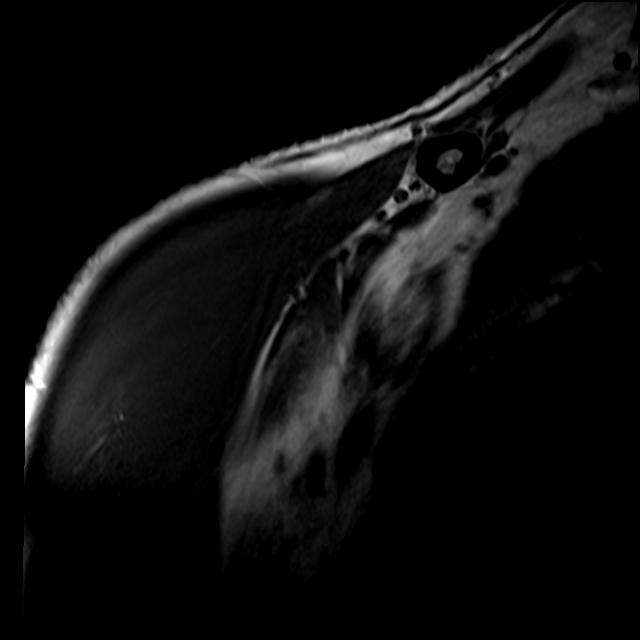
[im 5/21]
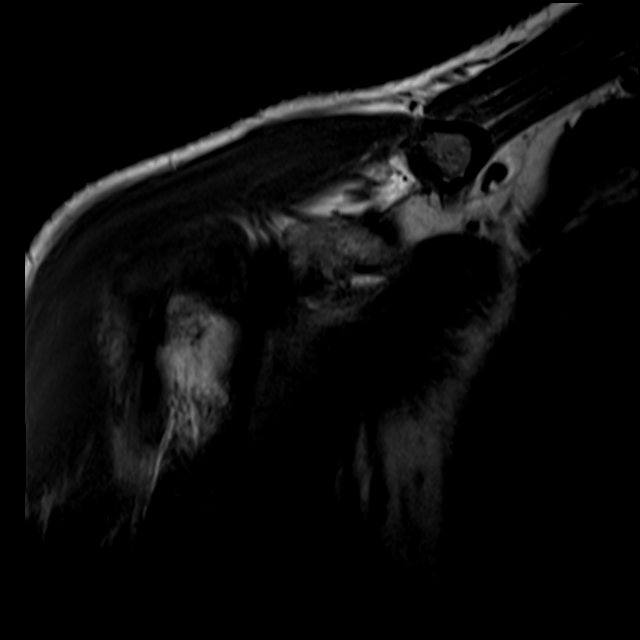
[im 9/21]
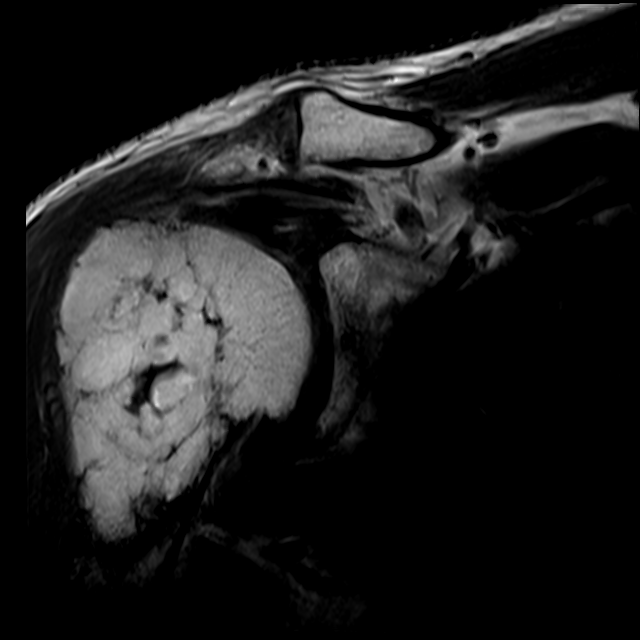
[im 13/21]
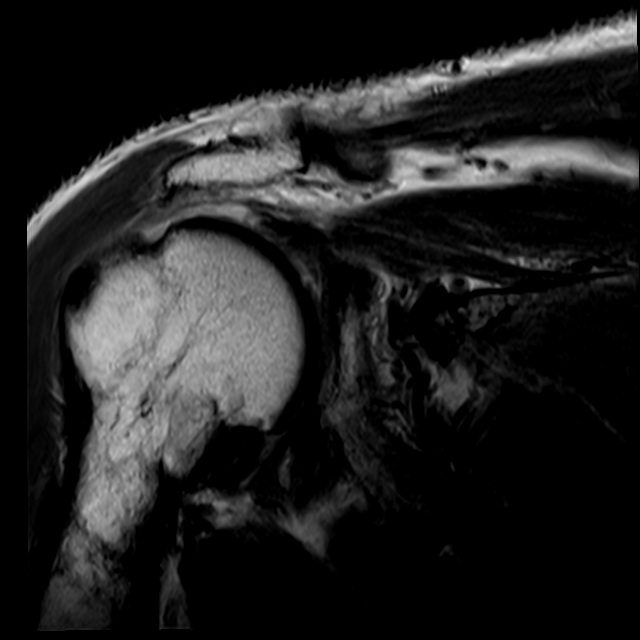
[im 17/21]
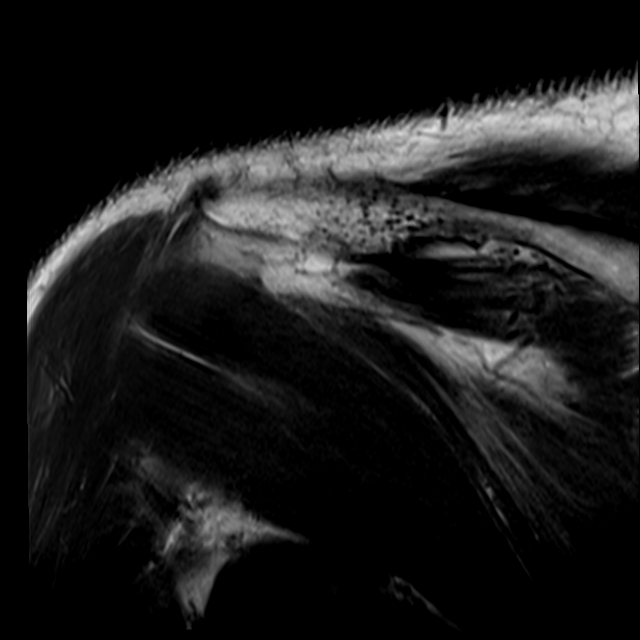
[im 21/21]
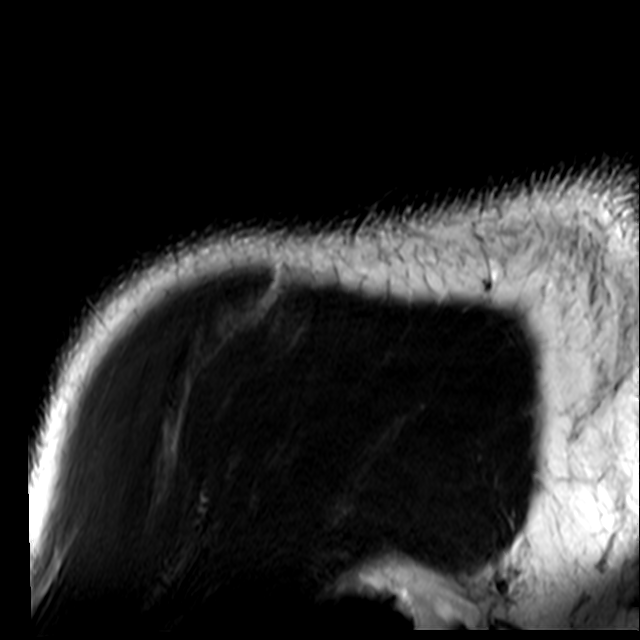

[Series 11: T2 fat-sat · axial · right · 3.0mm · 0.47mm/px · z∈[-45,+29]mm · 3 of 27 slices shown (3 of 3)]
[im 4/27]
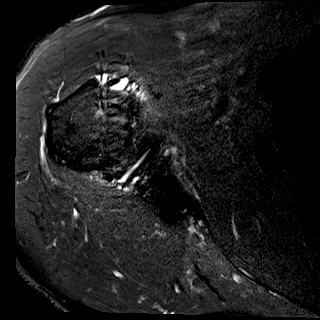
[im 15/27]
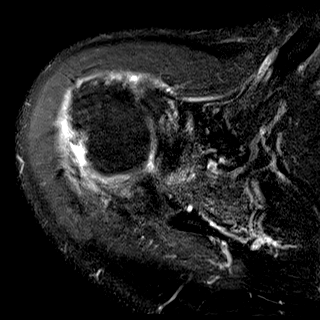
[im 23/27]
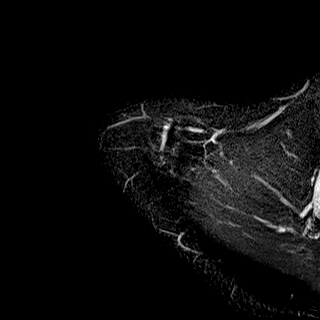

[18 of 40 positions shown; findings below may reference images not displayed]

FINDINGS: Rotator cuff: There is a massive full-thickness tear of nearly the
entire supraspinatus tendon and of at least the anterior 50% of the
infraspinatus tendon and region measuring up to 4.2 cm in AP
dimension. A small amount of anterior supraspinatus tendon fibers
may remain intact. Within the limitations of patient motion artifact
especially on axial images, no definite subscapularis tendon tear is
seen. The teres minor is intact.

Muscles: Moderate supraspinatus and mild anterior infraspinatus
muscle atrophy.

Biceps long head: Mild to moderate proximal long head of the biceps
intermediate T2 signal tendinosis. Possible linear fluid bright tear
extending into the midsubstance of the origin of the biceps tendon
(sagittal series 100, image 12), likely an extension of a superior
labral tear through the biceps-labral junction.

Acromioclavicular Joint: There are mild-to-moderate degenerative
changes of the acromioclavicular joint including joint space
narrowing, subchondral marrow edema, and peripheral osteophytosis.
Type II acromion. Mild glenohumeral joint effusion extending into
the subacromial/subdeltoid bursa.

Glenohumeral Joint: There is mild-to-moderate thinning of the
glenoid and humeral head cartilage. Minimal inferior humeral
head-neck junction degenerative osteophytosis.

Labrum: There is linear increased T2 signal indicating a tear of the
posterosuperior quadrant of the glenoid labrum (coronal series 7
images 8 through 10.

Bones: On recent right shoulder radiographs, there is a mixed lytic
and sclerotic lesion of the proximal humeral metaphysis and
diaphysis. On MRI there appears to be predominantly normal marrow
fat in this region, with curvilinear decreased T1 and decreased T2
signal regions corresponding to the areas of sclerosis on
radiograph. There is also mild fluid bright signal within the
superior aspect of this lesion and region measuring up to 1.5 cm in
craniocaudal dimension and 1.5 by 1.0 cm in transverse dimension,
however portions of this lesion are smaller in transverse dimension
(on axial images). There is no definite marrow edema surrounding
this fluid. No cortical destruction is seen. No extraosseous soft
tissue mass. Overall the proximal humeral lesion is favored to have
a benign radiographic and MRI appearance. The curvilinear sclerotic
regions appear to be partially demarcate superior and inferior
borders with this lesion measuring up to approximately 6.4 cm in
craniocaudal dimension (as measured on sagittal image 19).

Other: None.
IMPRESSION: :
IMPRESSION: 1. The proximal humeral lesion seen on prior radiographs
demonstrates mixed lucency and curvilinear sclerosis on radiographs.
On MRI, the lucency corresponds to predominantly normal-appearing
marrow fat. There are curvilinear areas of sclerosis and a small mid
to superior fluid signal region. No cortical destruction or
extraosseous soft tissue mass is seen. The appearance is compatible
with a stage III intraosseous lipoma with the small area of central
fluid bright signal representing granulation tissue/necrosis and
scattered and peripheral areas of sclerotic bony septations.
Recommend clinical correlation for possible pain directly related to
this region of the proximal humeral metadiaphysis. Consider
follow-up radiographs in 3 months to assess radiographic stability.
Follow-up MRI at 6 months may be considered to ensure stability of
this lesion that is favored to be benign.
2. Large full-thickness tear nearly the entire supraspinatus and the
majority of the infraspinatus tendons. Moderate supraspinatus and
mild anterior infraspinatus muscle atrophy suggests at least a
component of these tears is chronic.
3. Mild-to-moderate proximal long head of the biceps tendinosis with
possible linear interstitial tear.
4. Superior glenoid labrum likely degenerative tears.
5. Mild-to-moderate degenerative changes of the acromioclavicular
joint.

## 2021-03-05 ENCOUNTER — Other Ambulatory Visit (HOSPITAL_BASED_OUTPATIENT_CLINIC_OR_DEPARTMENT_OTHER): Payer: Self-pay

## 2021-03-05 ENCOUNTER — Ambulatory Visit (INDEPENDENT_AMBULATORY_CARE_PROVIDER_SITE_OTHER): Payer: BC Managed Care – PPO | Admitting: Orthopaedic Surgery

## 2021-03-05 ENCOUNTER — Other Ambulatory Visit: Payer: Self-pay

## 2021-03-05 DIAGNOSIS — G8929 Other chronic pain: Secondary | ICD-10-CM | POA: Diagnosis not present

## 2021-03-05 DIAGNOSIS — M25511 Pain in right shoulder: Secondary | ICD-10-CM | POA: Diagnosis not present

## 2021-03-05 DIAGNOSIS — M1711 Unilateral primary osteoarthritis, right knee: Secondary | ICD-10-CM

## 2021-03-05 MED ORDER — LIDOCAINE HCL 1 % IJ SOLN
4.0000 mL | INTRAMUSCULAR | Status: AC | PRN
  Administered 2021-03-05: 4 mL

## 2021-03-05 MED ORDER — ASPIRIN EC 325 MG PO TBEC
325.0000 mg | DELAYED_RELEASE_TABLET | Freq: Every day | ORAL | 0 refills | Status: AC
  Filled 2021-03-05: qty 30, 30d supply, fill #0

## 2021-03-05 MED ORDER — ACETAMINOPHEN 500 MG PO TABS
500.0000 mg | ORAL_TABLET | Freq: Three times a day (TID) | ORAL | 0 refills | Status: AC
  Filled 2021-03-05: qty 30, 10d supply, fill #0

## 2021-03-05 MED ORDER — TRIAMCINOLONE ACETONIDE 40 MG/ML IJ SUSP
80.0000 mg | INTRAMUSCULAR | Status: AC | PRN
  Administered 2021-03-05: 80 mg via INTRA_ARTICULAR

## 2021-03-05 MED ORDER — OXYCODONE HCL 5 MG PO TABS
5.0000 mg | ORAL_TABLET | ORAL | 0 refills | Status: AC | PRN
  Filled 2021-03-05: qty 20, 4d supply, fill #0

## 2021-03-05 MED ORDER — IBUPROFEN 800 MG PO TABS
800.0000 mg | ORAL_TABLET | Freq: Three times a day (TID) | ORAL | 0 refills | Status: AC
  Filled 2021-03-05: qty 30, 10d supply, fill #0

## 2021-03-05 NOTE — Patient Instructions (Signed)
Disability and Out-of-Work Paperwork  If you would like to file disability (short term and/or and long term), FMLA, or other out-of-work paperwork, please mail, hand deliver, or fax the paperwork to our main office:   [facility]  [address], [place] [address] Phone: [phone] Fax: [phone]  We are unable to complete these forms in our [place] [facility] [place] office and want to ensure your paperwork is filed in an appropriate and proper manner.   Thank you for understanding.   Pre-Operative Appointment  Post (after) Operative Medications:   You were likely prescribed 4 medications:  A. Acetaminophen (Tylenol) 500 MG B. Ibuprofen (Advil) 800 MG C. Oxycodone 5 MG D. Aspirin 325 MG  These medications do not need to be taken until AFTER  surgery. All medications should be taken according to prescription, pharmacy and prescriber direction.   These medications can be picked up from the [facility] on the first floor of [facility] immediately after your appointment.   Aspirin should be taken once a day for 30 days following surgery to prevent complications, such as blood clots.   Oxycodone is an opioid and should be used for breakthrough pain.   Acetaminophen and Ibuprofen should be used intermittently for pain control.    2. Surgery Scheduling  Our surgery scheduler, April Beavers, will be in contact with you to schedule your surgery. She will give you details on the day, time and the location your surgery will be performed. April Beavers will also schedule your first post-operative appointment 2 weeks after your scheduled surgery date. This appointment will be in our office at [facility], not the location where surgery is performed.   Closer to the date of your surgery, a nurse from the hospital or surgery center will contact you about specific details prior to  surgery such as when to cease eating, when to arrive at the facility, and what you can expect the day of surgery.   April Beavers  [phone]    3. Durable Medical Equipment:  If you were given a medical device at your appointment such as, shoulder sling, post operative knee brace, or walking boot, you will bring that to the hospital/surgery center with you when you check in the day of surgery.   Be sure to inform the pre-operative staff that the equipment should go with you to the operating room.   You will wake up after surgery with the equipment on and will continue to wear it as explained by Dr. Sammuel Hines.    Physical Therapy After Surgery  We have sent a referral to the physical therapy office in the [facility], located on the first floor within the [place] [facility].   If you have an active MyChart account, look for an invitation via MyChart to schedule your Physical Therapy evaluation. If you do not have an active mychart, [facility] will reach out within 3 business days. Feel free to contact them directly to schedule your evaluation at [phone].   If you are having surgery, you may still use mychart to schedule your evaluation but please also call to preschedule your follow up appointments.   Please schedule your evaluation within 3-5 days of your surgery if you choose to use MyChart or contact the physical therapy office directly.   If you have any questions about physical therapy you may contact Selinda Eon, PT, DPT at [phone].
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
## 2021-03-25 ENCOUNTER — Ambulatory Visit (HOSPITAL_BASED_OUTPATIENT_CLINIC_OR_DEPARTMENT_OTHER): Payer: Self-pay | Admitting: Orthopaedic Surgery

## 2021-03-25 DIAGNOSIS — M1711 Unilateral primary osteoarthritis, right knee: Secondary | ICD-10-CM

## 2021-03-25 NOTE — Progress Notes (Signed)
Surgical Instructions    Your procedure is scheduled on Thursday, March 2nd, 2023.   Report to [facility] Main Entrance "A" at 05:30 A.M., then check in with the Admitting office.  Call this number if you have problems the morning of surgery:  [phone]   If you have any questions prior to your surgery date call [phone]: Open Monday-Friday 8am-4pm    Remember:  Do not eat after midnight the night before your surgery  You may drink clear liquids until 04:30 the morning of your surgery.   Clear liquids allowed are: Water, Non-Citrus Juices (without pulp), Carbonated Beverages, Clear Tea, Black Coffee ONLY (NO MILK, CREAM OR POWDERED CREAMER of any kind), and Gatorade    Take these medicines the morning of surgery with A SIP OF WATER: NONE   Follow your surgeon's instructions on when to stop Aspirin.  If no instructions were given by your surgeon then you will need to call the office to get those instructions.     As of today, STOP taking any Aspirin (unless otherwise instructed by your surgeon) Aleve, Naproxen, Ibuprofen, Motrin, Advil, Goody's, BC's, all herbal medications, fish oil, and all vitamins.    The day of surgery:         Do not wear jewelry  Do not wear lotions, powders, colognes, or deodorant. Men may shave face and neck. Do not bring valuables to the hospital.   [facility] is not responsible for any belongings or valuables. .   Do NOT Smoke (Tobacco/Vaping)  24 hours prior to your procedure  If you use a CPAP at night, you may bring your mask for your overnight stay.   Contacts, glasses, hearing aids, dentures or partials may not be worn into surgery, please bring cases for these belongings   For patients admitted to the hospital, discharge time will be determined by your treatment team.   Patients discharged the day of surgery will not be allowed to drive home, and someone needs to stay with them for 24 hours.  NO VISITORS WILL BE ALLOWED IN PRE-OP  WHERE PATIENTS ARE PREPPED FOR SURGERY.  ONLY 1 SUPPORT PERSON MAY BE PRESENT IN THE WAITING ROOM WHILE YOU ARE IN SURGERY.  IF YOU ARE TO BE ADMITTED, ONCE YOU ARE IN YOUR ROOM YOU WILL BE ALLOWED TWO (2) VISITORS. 1 (ONE) VISITOR MAY STAY OVERNIGHT BUT MUST ARRIVE TO THE ROOM BY 8pm.  Minor children may have two parents present. Special consideration for safety and communication needs will be reviewed on a case by case basis.  Special instructions:    Oral Hygiene is also important to reduce your risk of infection.  Remember - BRUSH YOUR TEETH THE MORNING OF SURGERY WITH YOUR REGULAR TOOTHPASTE   [place]- Preparing For Surgery  Before surgery, you can play an important role. Because skin is not sterile, your skin needs to be as free of germs as possible. You can reduce the number of germs on your skin by washing with CHG (chlorahexidine gluconate) Soap before surgery.  CHG is an antiseptic cleaner which kills germs and bonds with the skin to continue killing germs even after washing.     Please do not use if you have an allergy to CHG or antibacterial soaps. If your skin becomes reddened/irritated stop using the CHG.  Do not shave (including legs and underarms) for at least 48 hours prior to first CHG shower. It is OK to shave your face.  Please follow these instructions carefully.  Shower the NIGHT BEFORE SURGERY and the MORNING OF SURGERY with CHG Soap.   If you chose to wash your hair, wash your hair first as usual with your normal shampoo. After you shampoo, rinse your hair and body thoroughly to remove the shampoo.  Then [employer] and genitals (private parts) with your normal soap and rinse thoroughly to remove soap.  After that Use CHG Soap as you would any other liquid soap. You can apply CHG directly to the skin and wash gently with a scrungie or a clean washcloth.   Apply the CHG Soap to your body ONLY FROM THE NECK DOWN.  Do not use on open wounds or open sores. Avoid  contact with your eyes, ears, mouth and genitals (private parts). Wash Face and genitals (private parts)  with your normal soap.   Wash thoroughly, paying special attention to the area where your surgery will be performed.  Thoroughly rinse your body with warm water from the neck down.  DO NOT shower/wash with your normal soap after using and rinsing off the CHG Soap.  Pat yourself dry with a CLEAN TOWEL.  Wear CLEAN PAJAMAS to bed the night before surgery  Place CLEAN SHEETS on your bed the night before your surgery  DO NOT SLEEP WITH PETS.   Day of Surgery:  Take a shower with CHG soap. Wear Clean/Comfortable clothing the morning of surgery Do not apply any deodorants/lotions.   Remember to brush your teeth WITH YOUR REGULAR TOOTHPASTE.    COVID testing  If you are going to stay overnight or be admitted after your procedure/surgery and require a pre-op COVID test, please follow these instructions after your COVID test   You are not required to quarantine however you are required to wear a well-fitting mask when you are out and around people not in your household.  If your mask becomes wet or soiled, replace with a new one.  Wash your hands often with soap and water for 20 seconds or clean your hands with an alcohol-based hand sanitizer that contains at least 60% alcohol.  Do not share personal items.  Notify your provider: if you are in close contact with someone who has COVID  or if you develop a fever of 100.4 or greater, sneezing, cough, sore throat, shortness of breath or body aches.    Please read over the following fact sheets that you were given.

## 2021-03-26 ENCOUNTER — Encounter (HOSPITAL_COMMUNITY): Payer: Self-pay

## 2021-03-26 ENCOUNTER — Other Ambulatory Visit: Payer: Self-pay

## 2021-03-26 ENCOUNTER — Encounter (HOSPITAL_COMMUNITY)
Admission: RE | Admit: 2021-03-26 | Discharge: 2021-03-26 | Disposition: A | Discharge status: Home / Self Care | Payer: BC Managed Care – PPO | Source: Ambulatory Visit | Attending: Orthopaedic Surgery | Admitting: Orthopaedic Surgery

## 2021-03-26 VITALS — BP 146/91 | HR 79 | Temp 98.0°F | Resp 17 | Ht 75.0 in | Wt 249.4 lb

## 2021-03-26 DIAGNOSIS — Z01812 Encounter for preprocedural laboratory examination: Secondary | ICD-10-CM | POA: Insufficient documentation

## 2021-03-26 DIAGNOSIS — Z01818 Encounter for other preprocedural examination: Secondary | ICD-10-CM

## 2021-03-26 HISTORY — DX: Gastro-esophageal reflux disease without esophagitis: K21.9

## 2021-03-26 LAB — CBC
HCT: 44.5 % (ref 39.0–52.0)
Hemoglobin: 14.5 g/dL (ref 13.0–17.0)
MCH: 29.3 pg (ref 26.0–34.0)
MCHC: 32.6 g/dL (ref 30.0–36.0)
MCV: 89.9 fL (ref 80.0–100.0)
Platelets: 187 10*3/uL (ref 150–400)
RBC: 4.95 MIL/uL (ref 4.22–5.81)
RDW: 13.8 % (ref 11.5–15.5)
WBC: 4.5 10*3/uL (ref 4.0–10.5)
nRBC: 0 % (ref 0.0–0.2)

## 2021-03-26 NOTE — Progress Notes (Signed)
PCP - Scarlette Calico Cardiologist - denies  Chest x-ray - n/a EKG - 10/24/20  ERAS Protcol - yes, Ensure given   COVID TEST- n/a (ambulatory)   Anesthesia review: n/a  Patient denies shortness of breath, fever, cough and chest pain at PAT appointment   All instructions explained to the patient, with a verbal understanding of the material. Patient agrees to go over the instructions while at home for a better understanding. Patient also instructed to self quarantine after being tested for COVID-19. The opportunity to ask questions was provided.

## 2021-03-26 NOTE — Progress Notes (Signed)
Surgical Instructions    Your procedure is scheduled on Thursday, March 2nd, 2023.   Report to [facility] Main Entrance "A" at 05:30 A.M., then check in with the Admitting office.  Call this number if you have problems the morning of surgery:  [phone]   If you have any questions prior to your surgery date call [phone]: Open Monday-Friday 8am-4pm    Remember:  Do not eat after midnight the night before your surgery  You may drink clear liquids until 04:30 the morning of your surgery.   Clear liquids allowed are: Water, Non-Citrus Juices (without pulp), Carbonated Beverages, Clear Tea, Black Coffee ONLY (NO MILK, CREAM OR POWDERED CREAMER of any kind), and Gatorade    Take these medicines the morning of surgery with A SIP OF WATER   If needed:  Tylenol  Esomeprazole (Nexium)  Follow your surgeon's instructions on when to stop Aspirin.  If no instructions were given by your surgeon then you will need to call the office to get those instructions.     As of today, STOP taking any Aspirin (unless otherwise instructed by your surgeon) Aleve, Naproxen, Ibuprofen, Motrin, Advil, Goody's, BC's, all herbal medications, fish oil, and all vitamins.  Please complete your PRE-SURGERY ENSURE that was provided to you by 4:30 AM the morning of surgery.  Please, if able, drink it in one setting. DO NOT SIP.     The day of surgery: Do not wear jewelry  Do not wear lotions, powders, colognes, or deodorant. Men may shave face and neck. Do not bring valuables to the hospital.   [facility] is not responsible for any belongings or valuables. .   Do NOT Smoke (Tobacco/Vaping)  24 hours prior to your procedure  If you use a CPAP at night, you may bring your mask for your overnight stay.   Contacts, glasses, hearing aids, dentures or partials may not be worn into surgery, please bring cases for these belongings   For patients admitted to the hospital, discharge time will be determined  by your treatment team.   Patients discharged the day of surgery will not be allowed to drive home, and someone needs to stay with them for 24 hours.  NO VISITORS WILL BE ALLOWED IN PRE-OP WHERE PATIENTS ARE PREPPED FOR SURGERY.  ONLY 1 SUPPORT PERSON MAY BE PRESENT IN THE WAITING ROOM WHILE YOU ARE IN SURGERY.  IF YOU ARE TO BE ADMITTED, ONCE YOU ARE IN YOUR ROOM YOU WILL BE ALLOWED TWO (2) VISITORS. 1 (ONE) VISITOR MAY STAY OVERNIGHT BUT MUST ARRIVE TO THE ROOM BY 8pm.  Minor children may have two parents present. Special consideration for safety and communication needs will be reviewed on a case by case basis.  Special instructions:    Oral Hygiene is also important to reduce your risk of infection.  Remember - BRUSH YOUR TEETH THE MORNING OF SURGERY WITH YOUR REGULAR TOOTHPASTE   [facility]- Preparing For Surgery  Before surgery, you can play an important role. Because skin is not sterile, your skin needs to be as free of germs as possible. You can reduce the number of germs on your skin by washing with CHG (chlorahexidine gluconate) Soap before surgery.  CHG is an antiseptic cleaner which kills germs and bonds with the skin to continue killing germs even after washing.     Please do not use if you have an allergy to CHG or antibacterial soaps. If your skin becomes reddened/irritated stop using the CHG.  Do  not shave (including legs and underarms) for at least 48 hours prior to first CHG shower. It is OK to shave your face.  Please follow these instructions carefully.     Shower the NIGHT BEFORE SURGERY and the MORNING OF SURGERY with CHG Soap.   If you chose to wash your hair, wash your hair first as usual with your normal shampoo. After you shampoo, rinse your hair and body thoroughly to remove the shampoo.  Then [employer] and genitals (private parts) with your normal soap and rinse thoroughly to remove soap.  After that Use CHG Soap as you would any other liquid soap. You can apply  CHG directly to the skin and wash gently with a scrungie or a clean washcloth.   Apply the CHG Soap to your body ONLY FROM THE NECK DOWN.  Do not use on open wounds or open sores. Avoid contact with your eyes, ears, mouth and genitals (private parts). Wash Face and genitals (private parts)  with your normal soap.   Wash thoroughly, paying special attention to the area where your surgery will be performed.  Thoroughly rinse your body with warm water from the neck down.  DO NOT shower/wash with your normal soap after using and rinsing off the CHG Soap.  Pat yourself dry with a CLEAN TOWEL.  Wear CLEAN PAJAMAS to bed the night before surgery  Place CLEAN SHEETS on your bed the night before your surgery  DO NOT SLEEP WITH PETS.   Day of Surgery:  Take a shower with CHG soap. Wear Clean/Comfortable clothing the morning of surgery Do not apply any deodorants/lotions.   Remember to brush your teeth WITH YOUR REGULAR TOOTHPASTE.    COVID testing  If you are going to stay overnight or be admitted after your procedure/surgery and require a pre-op COVID test, please follow these instructions after your COVID test   You are not required to quarantine however you are required to wear a well-fitting mask when you are out and around people not in your household.  If your mask becomes wet or soiled, replace with a new one.  Wash your hands often with soap and water for 20 seconds or clean your hands with an alcohol-based hand sanitizer that contains at least 60% alcohol.  Do not share personal items.  Notify your provider: if you are in close contact with someone who has COVID  or if you develop a fever of 100.4 or greater, sneezing, cough, sore throat, shortness of breath or body aches.    Please read over the following fact sheets that you were given.

## 2021-04-02 NOTE — Anesthesia Preprocedure Evaluation (Addendum)
Anesthesia Evaluation  ?Patient identified by MRN, date of birth, ID band ?Patient awake ? ? ? ?Reviewed: ?Allergy & Precautions, H&P , NPO status , Patient's Chart, lab work & pertinent test results ? ?Airway ?Mallampati: II ? ?TM Distance: >3 FB ?Neck ROM: Full ? ? ? Dental ?no notable dental hx. ?(+) Teeth Intact, Dental Advisory Given ?  ?Pulmonary ?neg pulmonary ROS,  ?  ?Pulmonary exam normal ?breath sounds clear to auscultation ? ? ? ? ? ? Cardiovascular ?Exercise Tolerance: Good ?negative cardio ROS ? ? ?Rhythm:Regular Rate:Normal ? ? ?  ?Neuro/Psych ?negative neurological ROS ? negative psych ROS  ? GI/Hepatic ?Neg liver ROS, GERD  Medicated,  ?Endo/Other  ?negative endocrine ROS ? Renal/GU ?negative Renal ROS  ?negative genitourinary ?  ?Musculoskeletal ? ?(+) Arthritis , Osteoarthritis,   ? Abdominal ?  ?Peds ? Hematology ?negative hematology ROS ?(+)   ?Anesthesia Other Findings ? ? Reproductive/Obstetrics ?negative OB ROS ? ?  ? ? ? ? ? ? ? ? ? ? ? ? ? ?  ?  ? ? ? ? ? ? ? ?Anesthesia Physical ?Anesthesia Plan ? ?ASA: 2 ? ?Anesthesia Plan: General  ? ?Post-op Pain Management: Regional block* and Tylenol PO (pre-op)*  ? ?Induction: Intravenous ? ?PONV Risk Score and Plan: 3 and Ondansetron, Dexamethasone and Midazolam ? ?Airway Management Planned: Oral ETT ? ?Additional Equipment:  ? ?Intra-op Plan:  ? ?Post-operative Plan: Extubation in OR ? ?Informed Consent: I have reviewed the patients History and Physical, chart, labs and discussed the procedure including the risks, benefits and alternatives for the proposed anesthesia with the patient or authorized representative who has indicated his/her understanding and acceptance.  ? ? ? ?Dental advisory given ? ?Plan Discussed with: CRNA ? ?Anesthesia Plan Comments:   ? ? ? ? ? ?Anesthesia Quick Evaluation ? ?

## 2021-04-03 ENCOUNTER — Ambulatory Visit (HOSPITAL_COMMUNITY)
Admission: RE | Admit: 2021-04-03 | Discharge: 2021-04-03 | Disposition: A | Discharge status: Home / Self Care | Payer: BC Managed Care – PPO | Attending: Orthopaedic Surgery | Admitting: Orthopaedic Surgery

## 2021-04-03 ENCOUNTER — Encounter (HOSPITAL_COMMUNITY): Payer: Self-pay | Admitting: Orthopaedic Surgery

## 2021-04-03 ENCOUNTER — Other Ambulatory Visit: Payer: Self-pay

## 2021-04-03 ENCOUNTER — Ambulatory Visit (HOSPITAL_COMMUNITY): Payer: BC Managed Care – PPO | Admitting: Anesthesiology

## 2021-04-03 ENCOUNTER — Encounter (HOSPITAL_COMMUNITY)
Admission: RE | Disposition: A | Discharge status: Home / Self Care | Payer: Self-pay | Source: Home / Self Care | Attending: Orthopaedic Surgery

## 2021-04-03 ENCOUNTER — Encounter (HOSPITAL_BASED_OUTPATIENT_CLINIC_OR_DEPARTMENT_OTHER): Payer: Self-pay | Admitting: Orthopaedic Surgery

## 2021-04-03 DIAGNOSIS — M1711 Unilateral primary osteoarthritis, right knee: Secondary | ICD-10-CM | POA: Insufficient documentation

## 2021-04-03 DIAGNOSIS — M7551 Bursitis of right shoulder: Secondary | ICD-10-CM | POA: Diagnosis not present

## 2021-04-03 DIAGNOSIS — M7552 Bursitis of left shoulder: Secondary | ICD-10-CM

## 2021-04-03 DIAGNOSIS — M659 Synovitis and tenosynovitis, unspecified: Secondary | ICD-10-CM | POA: Diagnosis not present

## 2021-04-03 DIAGNOSIS — K219 Gastro-esophageal reflux disease without esophagitis: Secondary | ICD-10-CM | POA: Diagnosis not present

## 2021-04-03 DIAGNOSIS — S46011A Strain of muscle(s) and tendon(s) of the rotator cuff of right shoulder, initial encounter: Secondary | ICD-10-CM

## 2021-04-03 DIAGNOSIS — M75121 Complete rotator cuff tear or rupture of right shoulder, not specified as traumatic: Secondary | ICD-10-CM

## 2021-04-03 DIAGNOSIS — M25511 Pain in right shoulder: Secondary | ICD-10-CM | POA: Diagnosis not present

## 2021-04-03 DIAGNOSIS — S43431A Superior glenoid labrum lesion of right shoulder, initial encounter: Secondary | ICD-10-CM | POA: Diagnosis not present

## 2021-04-03 DIAGNOSIS — X58XXXA Exposure to other specified factors, initial encounter: Secondary | ICD-10-CM | POA: Insufficient documentation

## 2021-04-03 HISTORY — PX: SHOULDER ARTHROSCOPY WITH ROTATOR CUFF REPAIR: SHX5685

## 2021-04-03 SURGERY — ARTHROSCOPY, SHOULDER, WITH ROTATOR CUFF REPAIR
Anesthesia: General | Laterality: Right

## 2021-04-03 MED ORDER — ACETAMINOPHEN 500 MG PO TABS: 1000.0000 mg | ORAL_TABLET | Freq: Once | ORAL | Status: DC

## 2021-04-03 MED ORDER — LACTATED RINGERS IV SOLN: INTRAVENOUS | Status: DC

## 2021-04-03 MED ORDER — PHENYLEPHRINE HCL-NACL 20-0.9 MG/250ML-% IV SOLN
INTRAVENOUS | Status: DC | PRN
  Administered 2021-04-03: 25 ug/min via INTRAVENOUS

## 2021-04-03 MED ORDER — SODIUM CHLORIDE 0.9 % IR SOLN
Status: DC | PRN
  Administered 2021-04-03 (×3): 3000 mL

## 2021-04-03 MED ORDER — AMISULPRIDE (ANTIEMETIC) 5 MG/2ML IV SOLN
10.0000 mg | Freq: Once | INTRAVENOUS | Status: AC
  Administered 2021-04-03: 10 mg via INTRAVENOUS

## 2021-04-03 MED ORDER — ROCURONIUM BROMIDE 10 MG/ML (PF) SYRINGE
PREFILLED_SYRINGE | INTRAVENOUS | Status: DC | PRN
Start: 2021-04-03 — End: 2021-04-03
  Administered 2021-04-03: 60 mg via INTRAVENOUS

## 2021-04-03 MED ORDER — MIDAZOLAM HCL 2 MG/2ML IJ SOLN
INTRAMUSCULAR | Status: DC | PRN
  Administered 2021-04-03: 2 mg via INTRAVENOUS

## 2021-04-03 MED ORDER — CHLORHEXIDINE GLUCONATE 0.12 % MT SOLN
15.0000 mL | Freq: Once | OROMUCOSAL | Status: AC
  Administered 2021-04-03: 15 mL via OROMUCOSAL

## 2021-04-03 MED ORDER — TRANEXAMIC ACID-NACL 1000-0.7 MG/100ML-% IV SOLN
1000.0000 mg | INTRAVENOUS | Status: AC
  Administered 2021-04-03: 1000 mg via INTRAVENOUS

## 2021-04-03 MED ORDER — FENTANYL CITRATE (PF) 250 MCG/5ML IJ SOLN
INTRAMUSCULAR | Status: AC
  Filled 2021-04-03: qty 5

## 2021-04-03 MED ORDER — AMISULPRIDE (ANTIEMETIC) 5 MG/2ML IV SOLN
INTRAVENOUS | Status: AC
  Filled 2021-04-03: qty 4

## 2021-04-03 MED ORDER — FENTANYL CITRATE (PF) 250 MCG/5ML IJ SOLN
INTRAMUSCULAR | Status: DC | PRN
  Administered 2021-04-03: 150 ug via INTRAVENOUS

## 2021-04-03 MED ORDER — BUPIVACAINE HCL (PF) 0.25 % IJ SOLN
INTRAMUSCULAR | Status: AC
  Filled 2021-04-03: qty 30

## 2021-04-03 MED ORDER — EPINEPHRINE PF 1 MG/ML IJ SOLN
INTRAMUSCULAR | Status: AC
  Filled 2021-04-03: qty 2

## 2021-04-03 MED ORDER — HYDROMORPHONE HCL 1 MG/ML IJ SOLN: 0.2500 mg | INTRAMUSCULAR | Status: DC | PRN

## 2021-04-03 MED ORDER — ACETAMINOPHEN 10 MG/ML IV SOLN
INTRAVENOUS | Status: AC
  Filled 2021-04-03: qty 100

## 2021-04-03 MED ORDER — ONDANSETRON 8 MG PO TBDP: 8.0000 mg | ORAL_TABLET | Freq: Three times a day (TID) | ORAL | 0 refills | Status: AC | PRN

## 2021-04-03 MED ORDER — BUPIVACAINE LIPOSOME 1.3 % IJ SUSP
INTRAMUSCULAR | Status: DC | PRN
  Administered 2021-04-03: 10 mL via PERINEURAL

## 2021-04-03 MED ORDER — CEFAZOLIN SODIUM-DEXTROSE 2-4 GM/100ML-% IV SOLN
2.0000 g | INTRAVENOUS | Status: AC
  Administered 2021-04-03: 2 g via INTRAVENOUS

## 2021-04-03 MED ORDER — MIDAZOLAM HCL 2 MG/2ML IJ SOLN
INTRAMUSCULAR | Status: AC
  Filled 2021-04-03: qty 2

## 2021-04-03 MED ORDER — SUGAMMADEX SODIUM 200 MG/2ML IV SOLN
INTRAVENOUS | Status: DC | PRN
  Administered 2021-04-03: 226.2 mg via INTRAVENOUS

## 2021-04-03 MED ORDER — TRANEXAMIC ACID-NACL 1000-0.7 MG/100ML-% IV SOLN
INTRAVENOUS | Status: AC
  Filled 2021-04-03: qty 100

## 2021-04-03 MED ORDER — ACETAMINOPHEN 10 MG/ML IV SOLN
INTRAVENOUS | Status: DC | PRN
  Administered 2021-04-03: 1000 mg via INTRAVENOUS

## 2021-04-03 MED ORDER — ONDANSETRON HCL 4 MG/2ML IJ SOLN
INTRAMUSCULAR | Status: DC | PRN
  Administered 2021-04-03: 4 mg via INTRAVENOUS

## 2021-04-03 MED ORDER — GABAPENTIN 300 MG PO CAPS
ORAL_CAPSULE | ORAL | Status: AC
  Filled 2021-04-03: qty 1

## 2021-04-03 MED ORDER — ORAL CARE MOUTH RINSE: 15.0000 mL | Freq: Once | OROMUCOSAL | Status: AC

## 2021-04-03 MED ORDER — DEXAMETHASONE SODIUM PHOSPHATE 10 MG/ML IJ SOLN
INTRAMUSCULAR | Status: DC | PRN
  Administered 2021-04-03: 10 mg via INTRAVENOUS

## 2021-04-03 MED ORDER — CHLORHEXIDINE GLUCONATE 0.12 % MT SOLN
OROMUCOSAL | Status: AC
  Filled 2021-04-03: qty 15

## 2021-04-03 MED ORDER — BUPIVACAINE-EPINEPHRINE (PF) 0.5% -1:200000 IJ SOLN
INTRAMUSCULAR | Status: DC | PRN
  Administered 2021-04-03: 15 mL via PERINEURAL

## 2021-04-03 MED ORDER — EPINEPHRINE PF 1 MG/ML IJ SOLN
INTRAMUSCULAR | Status: DC | PRN
  Administered 2021-04-03 (×2): 1 mg

## 2021-04-03 MED ORDER — PROPOFOL 10 MG/ML IV BOLUS
INTRAVENOUS | Status: DC | PRN
  Administered 2021-04-03: 200 mg via INTRAVENOUS

## 2021-04-03 MED ORDER — CEFAZOLIN SODIUM-DEXTROSE 2-4 GM/100ML-% IV SOLN
INTRAVENOUS | Status: AC
  Filled 2021-04-03: qty 100

## 2021-04-03 MED ORDER — ACETAMINOPHEN 500 MG PO TABS
ORAL_TABLET | ORAL | Status: AC
  Filled 2021-04-03: qty 2

## 2021-04-03 MED ORDER — GABAPENTIN 300 MG PO CAPS
300.0000 mg | ORAL_CAPSULE | Freq: Once | ORAL | Status: AC
  Administered 2021-04-03: 300 mg via ORAL

## 2021-04-03 SURGICAL SUPPLY — 58 items
ANCHOR SUT BIOCOMP LK 2.9X12.5 (Anchor) ×1 IMPLANT
ANCHOR SUT FBRTK 2.6 SOFT 1.7 (Anchor) ×1 IMPLANT
ANCHOR SUT FBRTK 2.6 SP1.7 TAP (Anchor) ×1 IMPLANT
ANCHOR SWIVELOCK BIO 4.75X19.1 (Anchor) ×2 IMPLANT
BAG COUNTER SPONGE SURGICOUNT (BAG) ×2 IMPLANT
BLADE EXCALIBUR 4.0X13 (MISCELLANEOUS) ×2 IMPLANT
BLADE SURG 11 STRL SS (BLADE) IMPLANT
CANNULA 5.75X71 LONG (CANNULA) ×2 IMPLANT
CANNULA PASSPORT 5 (CANNULA) ×1 IMPLANT
CANNULA TWIST IN 8.25X7CM (CANNULA) ×2 IMPLANT
CHLORAPREP W/TINT 26 (MISCELLANEOUS) ×2 IMPLANT
COOLER ICEMAN CLASSIC (MISCELLANEOUS) ×2 IMPLANT
COVER SURGICAL LIGHT HANDLE (MISCELLANEOUS) ×2 IMPLANT
DRAPE HALF SHEET 40X57 (DRAPES) ×2 IMPLANT
DRAPE INCISE IOBAN 66X45 STRL (DRAPES) ×2 IMPLANT
DRAPE SHOULDER BEACH CHAIR (DRAPES) ×2 IMPLANT
DRAPE U-SHAPE 47X51 STRL (DRAPES) ×4 IMPLANT
DRSG TEGADERM 4X4.75 (GAUZE/BANDAGES/DRESSINGS) ×3 IMPLANT
DW OUTFLOW CASSETTE/TUBE SET (MISCELLANEOUS) ×2 IMPLANT
GAUZE SPONGE 4X4 12PLY STRL (GAUZE/BANDAGES/DRESSINGS) ×2 IMPLANT
GAUZE XEROFORM 1X8 LF (GAUZE/BANDAGES/DRESSINGS) ×1 IMPLANT
GAUZE XEROFORM 5X9 LF (GAUZE/BANDAGES/DRESSINGS) ×1 IMPLANT
GLOVE SRG 8 PF TXTR STRL LF DI (GLOVE) ×1 IMPLANT
GLOVE SURG ENC MOIS LTX SZ6 (GLOVE) ×6 IMPLANT
GLOVE SURG SYN 7.5  E (GLOVE) ×4
GLOVE SURG SYN 7.5 E (GLOVE) ×4 IMPLANT
GLOVE SURG SYN 7.5 PF PI (GLOVE) ×4 IMPLANT
GLOVE SURG UNDER POLY LF SZ6.5 (GLOVE) ×4 IMPLANT
GLOVE SURG UNDER POLY LF SZ8 (GLOVE) ×1
GOWN STRL REUS W/ TWL LRG LVL3 (GOWN DISPOSABLE) ×2 IMPLANT
GOWN STRL REUS W/TWL LRG LVL3 (GOWN DISPOSABLE) ×2
KIT BASIN OR (CUSTOM PROCEDURE TRAY) ×2 IMPLANT
KIT PUSHLOCK 2.9 HIP (KITS) ×1 IMPLANT
KIT STABILIZATION SHOULDER (MISCELLANEOUS) ×1 IMPLANT
KIT TURNOVER KIT B (KITS) ×2 IMPLANT
MANIFOLD NEPTUNE II (INSTRUMENTS) ×2 IMPLANT
NDL SCORPION MULTI FIRE (NEEDLE) IMPLANT
NDL SPNL 18GX3.5 QUINCKE PK (NEEDLE) ×1 IMPLANT
NEEDLE SCORPION MULTI FIRE (NEEDLE) ×2 IMPLANT
NEEDLE SPNL 18GX3.5 QUINCKE PK (NEEDLE) ×2 IMPLANT
NS IRRIG 1000ML POUR BTL (IV SOLUTION) ×2 IMPLANT
PACK ARTHROSCOPY DSU (CUSTOM PROCEDURE TRAY) ×2 IMPLANT
PACK SHOULDER (CUSTOM PROCEDURE TRAY) ×2 IMPLANT
PAD ABD 8X10 STRL (GAUZE/BANDAGES/DRESSINGS) ×1 IMPLANT
PAD ARMBOARD 7.5X6 YLW CONV (MISCELLANEOUS) ×4 IMPLANT
PAD COLD SHLDR WRAP-ON (PAD) ×1 IMPLANT
PORT APPOLLO RF 90DEGREE MULTI (SURGICAL WAND) ×2 IMPLANT
RESTRAINT HEAD UNIVERSAL NS (MISCELLANEOUS) ×2 IMPLANT
SLING ARM IMMOBILIZER LRG (SOFTGOODS) ×1 IMPLANT
SPONGE T-LAP 4X18 ~~LOC~~+RFID (SPONGE) ×2 IMPLANT
SUT ETHILON 3 0 PS 1 (SUTURE) ×2 IMPLANT
SYR 30ML LL (SYRINGE) ×2 IMPLANT
TAPE CLOTH SURG 4X10 WHT LF (GAUZE/BANDAGES/DRESSINGS) ×1 IMPLANT
TOWEL GREEN STERILE (TOWEL DISPOSABLE) ×2 IMPLANT
TOWEL GREEN STERILE FF (TOWEL DISPOSABLE) ×2 IMPLANT
TUBE CONNECTING 12X1/4 (SUCTIONS) ×2 IMPLANT
TUBING ARTHROSCOPY IRRIG 16FT (MISCELLANEOUS) ×2 IMPLANT
WATER STERILE IRR 1000ML POUR (IV SOLUTION) ×2 IMPLANT

## 2021-04-03 NOTE — Discharge Instructions (Signed)
? ? ?   Discharge Instructions  ? ? ?Attending Surgeon: Vanetta Mulders, MD ?Office Phone Number: [phone] ? ? ?Diagnosis and Procedures:   ? ?Surgeries Performed: ?Right shoulder arthroscopic rotator cuff repair ? ?Discharge Plan:  ? ? ?Diet: ?Resume usual diet. Begin with light or bland foods.  Drink plenty of fluids. ? ?Activity:  ?Keep sling and dressing in place until your follow up visit in Physical Therapy ?You are advised to go home directly from the hospital or surgical center. Restrict your activities. ? ?GENERAL INSTRUCTIONS: ?1.  Keep your surgical site elevated above your heart for at least 5-7 days or longer to prevent swelling. This will improve your comfort and your overall recovery following surgery.   ?  ?2. Please call Dr. Eddie Dibbles office at [phone] with questions Monday-Friday during business hours. If no one answers, please leave a message and someone should get back to the patient within 24 hours. For emergencies please call 911 or proceed to the emergency room.  ? ?3. Patient to notify surgical team if experiences any of the following: Bowel/Bladder dysfunction, uncontrolled pain, nerve/muscle weakness, incision with increased drainage or redness, nausea/vomiting and Fever greater than 101.0 F.  Be alert for signs of infection including redness, streaking, odor, fever or chills. Be alert for excessive pain or bleeding and notify your surgeon immediately. ? ?WOUND INSTRUCTIONS:   ?Leave your dressing/cast/splint in place until your post operative visit.  Keep it clean and dry. ? ?Always keep the incision clean and dry until the staples/sutures are removed. If there is no drainage from the incision you should keep it open to air. If there is drainage from the incision you must keep it covered at all times until the drainage stops ? Do not soak in a bath tub, hot tub, pool, lake or other body of water until 21 days after your surgery and your incision is completely dry and healed.  ?If  you have removable sutures (or staples) they must be removed 10-14 days (unless otherwise instructed) from the day of your surgery.  ? ? ? 1)  Elevate the extremity as much as possible. ? 2)  Keep the dressing clean and dry. ? 3)  Please call us if the dressing becomes wet or dirty. ? 4)  If you are experiencing worsening pain or worsening swelling, please call. ?  ?  ?MEDICATIONS: ?Resume all previous home medications at the previous prescribed dose and frequency unless otherwise noted ?Start taking the  pain medications on an as-needed basis as prescribed  ?Please taper down pain medication over the next week following surgery.  Ideally you should not require a refill of any narcotic pain medication.  ?Take pain medication with food to minimize nausea. ?In addition to the prescribed pain medication, you may take over-the-counter pain relievers such as Tylenol.  Do NOT take additional tylenol if your pain medication already has tylenol in it.  ?Aspirin 325mg  daily for four weeks. ? ?  ?  ?FOLLOWUP INSTRUCTIONS: ?1. Follow up at the Physical Therapy Clinic 3-4 days following surgery. This appointment should be scheduled unless other arrangements have been made.The Physical Therapy scheduling number is [phone] if an appointment has not already been arranged. ? ?2. Contact Dr. Eddie Dibbles office during office hours at [phone] or the practice after hours line at [phone] for non-emergencies. For medical emergencies call 911. ? ? ?Discharge Location: Home ? ?

## 2021-04-03 NOTE — Op Note (Signed)
? ?Date of Surgery: 04/03/2021 ? ?INDICATIONS: Thomas Small is a 56 y.o.-year-old male with a full-thickness right rotator cuff tear after an acute injury.  The risk and benefits of the procedure with discussed in detail and documented in the pre-operative evaluation. ? ?PREOPERATIVE DIAGNOSIS: 1.  Right full-thickness rotator cuff tear ?2.  Right subacromial bursitis ? ?POSTOPERATIVE DIAGNOSIS: Same. ? ?PROCEDURE: 1.  Right shoulder arthroscopic rotator cuff repair ?2.  Right shoulder extensive debridement ?3.  Right shoulder acromioplasty ? ?SURGEON: Yevonne Pax MD ? ?ASSISTANT: Izola Price ? ?ANESTHESIA:  general plus nerve block ? ?IV FLUIDS AND URINE: See anesthesia record. ? ?ANTIBIOTICS: Ancef 2 g ? ?ESTIMATED BLOOD LOSS: 10 mL. ? ?IMPLANTS:  ?Implant Name Type Inv. Item Serial No. Manufacturer Lot No. LRB No. Used Action  ?ANCHOR SUT FBRTK 2.6 SOFT 1.7 - [id number] Anchor ANCHOR SUT FBRTK 2.6 SOFT 1.7  [employer] [id number] Right 1 Implanted  ?ANCHOR SUT FBRTK 2.6 SP1.7 TAP - [id number] Anchor ANCHOR SUT FBRTK 2.6 SP1.7 TAP  [employer] [id number] Right 1 Implanted  ?[facility] SWIVELOCK BIO 4.75X19.1 - [id number] Anchor ANCHOR SWIVELOCK BIO 4.75X19.1  [employer] [id number] Right 1 Implanted  ?[facility] SWIVELOCK BIO 4.75X19.1 - [id number] Anchor ANCHOR SWIVELOCK BIO 4.75X19.1  [employer] [id number] Right 1 Implanted  ?ANCHOR SUT BIOCOMP LK 2.9X12.5 - [id number] Anchor ANCHOR SUT BIOCOMP LK 2.9X12.5  [employer] [id number] Right 1 Implanted  ? ? ?DRAINS: None ? ?CULTURES: None ? ?COMPLICATIONS: none ? ?DESCRIPTION OF PROCEDURE:  ?Examination under anesthesia revealed forward elevation of 150 degrees.  With the arm at the side, there was 65 degrees of external rotation.  There is a 1+ anterior load shift and a 1+ posterior load shift.  7 mm of inferior humeral head translation. ? ?Arthroscopic findings demonstrated: ? ?Glenoid cartilage: Grade 1 changes ?Humeral head: Grade 1 changes ?Labrum: Anterior fraying as well as  superior and posterior fraying ?Biceps insertion: Intact ?Biceps tendon: Intact without redness ?Subscapularis insertion: Intact ?Rotator cuff: Full-thickness massive rotator cuff tear with mild retraction ?Subacromial space: Significant subacromial bursitis ? ?I identified the patient in the pre-operative holding area.  I marked the operative right shoulder with my initials. I reviewed the risks and benefits of the proposed surgical intervention and the patient wished to proceed.  Anesthesia was then performed with regional block.  The patient was transferred to the operative suite and placed in the beach chair position with all bony prominences padded.   ?  ?SCDs were placed on bilateral lower extremity. Appropriate antibiotics was administered within 1 hour before incision. Regional anesthesia was administered.The operative extremity was then prepped and draped in standard fashion. A time out was performed confirming the correct extremity, correct patient and correct procedure. ? ?The arthroscope was introduced in the glenohumeral joint from a posterior portal.  An anterior portal was created.  The shoulder was examined and the above findings were noted.   ? ?With an arthroscopic shaver and an Apollo wand, synovitis throughout the  shoulder was resected.  The arthroscopic shaver was used to excise torn portions of the labrum back to a stable margin.  Specifically this was done for the anterior superior and posterior labrum. ? ?Through visualization from intra-articular, the footprint of the rotator cuff was debrided of soft tissues along with osteophytes that had formed.  This was done with an arthroscopic shaver.  The footprint was abraded with the arthroscopic shaver to create a bleeding bony surface to encourage healing.  ? ?The rotator cuff was  then approached through the subacromial space.  Anterior, lateral, and posterior portals were used.  Bursectomy was performed with an arthroscopic shaver and  [employer] wand.  The soft tissues on the undersurface of the acromion and clavicle were resected with the arthroscopic shaver and wand.  A medial release was performed and good excursion was noted of the tendon back to its footprint which would be amendable to an repair. ? ?The rotator cuff was repaired with a double row configuration with two medial Row all suture suture tack RC anchors with sutures passed from anterior to posterior in horizontal fashion with a scorpion suture passer.  The posterior 2 sutures were tied with knots in order to ultimately reapproximate the infraspinatus to its anatomic footprint.  A total of 8 limbs of suture were passed.  The sutures were then split and passed into 2 Lateral Row swivel lock anchors.  This provided excellent anatomic footprint approximation.  At this time there was a small dogear involving the anterior aspect of the rotator cuff.  A free suture was passed through this and this was placed into an anterior push lock with improved approximation. ? ?The shoulder was irrigated.  The arthroscopic instruments were removed.  Wounds were closed with 3-0 nylon sutures.  A sterile dressing was applied with xeroform, 4x8s, abdominal pad, and tape.  An Gar Gibbon was placed and the upper extremity was placed in a shoulder immobilizer.  The patient tolerated the procedure well and was taken to the recovery room in stable condition. ? ? ? ? ?POSTOPERATIVE PLAN: He will be nonweightbearing on the right upper extremity.  I will see him back in 2 weeks for suture removal and wound check.  He will see physical therapy this upcoming Monday. ? ?Yevonne Pax, MD ?9:20 AM ? ? ? ?

## 2021-04-03 NOTE — Anesthesia Procedure Notes (Signed)
Procedure Name: Intubation ?Date/Time: 04/03/2021 7:35 AM ?Performed by: Minerva Ends, CRNA ?Pre-anesthesia Checklist: Patient identified, Emergency Drugs available, Suction available and Patient being monitored ?Patient Re-evaluated:Patient Re-evaluated prior to induction ?Oxygen Delivery Method: Circle system utilized ?Preoxygenation: Pre-oxygenation with 100% oxygen ?Induction Type: IV induction ?Ventilation: Mask ventilation without difficulty ?Laryngoscope Size: Mac and 3 ?Grade View: Grade I ?Tube type: Oral ?Tube size: 7.0 mm ?Number of attempts: 1 ?Airway Equipment and Method: Stylet and Oral airway ?Placement Confirmation: ETT inserted through vocal cords under direct vision, positive ETCO2 and breath sounds checked- equal and bilateral ?Secured at: 23 cm ?Tube secured with: Tape ?Dental Injury: Teeth and Oropharynx as per pre-operative assessment  ? ? ? ? ?

## 2021-04-03 NOTE — Anesthesia Postprocedure Evaluation (Signed)
Anesthesia Post Note ? ?Patient: Thomas Small ? ?Procedure(s) Performed: Right SHOULDER ARTHROSCOPY WITH ROTATOR CUFF REPAIR (Right) ? ?  ? ?Patient location during evaluation: PACU ?Anesthesia Type: General and Regional ?Level of consciousness: awake and alert ?Pain management: pain level controlled ?Vital Signs Assessment: post-procedure vital signs reviewed and stable ?Respiratory status: spontaneous breathing, nonlabored ventilation and respiratory function stable ?Cardiovascular status: blood pressure returned to baseline and stable ?Postop Assessment: no apparent nausea or vomiting ?Anesthetic complications: no ? ? ?No notable events documented. ? ?Last Vitals:  ?Vitals:  ? 04/03/21 0955 04/03/21 1010  ?BP: (!) 137/91 (!) 145/73  ?Pulse: (!) 50 66  ?Resp: 12 (!) 21  ?Temp:  36.4 ?C  ?SpO2: 94% 96%  ?  ?Last Pain:  ?Vitals:  ? 04/03/21 1010  ?TempSrc:   ?PainSc: 0-No pain  ? ? ?  ?  ?  ?  ?  ?  ? ?Lilliana Turner,W. EDMOND ? ? ? ? ?

## 2021-04-03 NOTE — Transfer of Care (Signed)
Immediate Anesthesia Transfer of Care Note ? ?Patient: Thomas Small ? ?Procedure(s) Performed: Right SHOULDER ARTHROSCOPY WITH ROTATOR CUFF REPAIR (Right) ? ?Patient Location: PACU ? ?Anesthesia Type:GA combined with regional for post-op pain ? ?Level of Consciousness: sedated ? ?Airway & Oxygen Therapy: Patient Spontanous Breathing ? ?Post-op Assessment: Report given to RN and Post -op Vital signs reviewed and stable ? ?Post vital signs: Reviewed and stable ? ?Last Vitals:  ?Vitals Value Taken Time  ?BP 148/95 04/03/21 0938  ?Temp    ?Pulse 53 04/03/21 0938  ?Resp 12 04/03/21 0938  ?SpO2 93 % 04/03/21 0938  ?Vitals shown include unvalidated device data. ? ?Last Pain:  ?Vitals:  ? 04/03/21 0607  ?TempSrc: Oral  ?PainSc: 0-No pain  ?   ? ?  ? ?Complications: No notable events documented. ?

## 2021-04-03 NOTE — Interval H&P Note (Signed)
History and Physical Interval Note: ? ?04/03/2021 ?6:54 AM ? ?Thomas Small  has presented today for surgery, with the diagnosis of Right shoulder arthroscopy with rotator cuff repair.  The various methods of treatment have been discussed with the patient and family. After consideration of risks, benefits and other options for treatment, the patient has consented to  Procedure(s): ?Right SHOULDER ARTHROSCOPY WITH ROTATOR CUFF REPAIR (Right) as a surgical intervention.  The patient's history has been reviewed, patient examined, no change in status, stable for surgery.  I have reviewed the patient's chart and labs.  Questions were answered to the patient's satisfaction.   ? ? ?Vanetta Mulders ? ? ?

## 2021-04-03 NOTE — Anesthesia Procedure Notes (Signed)
Anesthesia Regional Block: Interscalene brachial plexus block  ? ?Pre-Anesthetic Checklist: , timeout performed,  Correct Patient, Correct Site, Correct Laterality,  Correct Procedure, Correct Position, site marked,  Risks and benefits discussed,  Pre-op evaluation,  At surgeon's request and post-op pain management ? ?Laterality: Right ? ?Prep: Maximum Sterile Barrier Precautions used, chloraprep     ?  ?Needles:  ?Injection technique: Single-shot ? ?Needle Type: Echogenic Stimulator Needle   ? ? ?Needle Length: 5cm  ?Needle Gauge: 22  ? ? ? ?Additional Needles: ? ? ?Procedures:,,,, ultrasound used (permanent image in chart),,    ?Narrative:  ?Start time: 04/03/2021 6:58 AM ?End time: 04/03/2021 7:08 AM ?Injection made incrementally with aspirations every 5 mL. ? ?Performed by: Personally  ?Anesthesiologist: Roderic Palau, MD ? ? ? ? ?

## 2021-04-03 NOTE — Brief Op Note (Signed)
? ?  Brief Op Note ? ?Date of Surgery: ?04/03/2021 ? ?Preoperative Diagnosis: ?Right shoulder arthroscopy with rotator cuff repair ? ?Postoperative Diagnosis: ?same ? ?Procedure: ?Procedure(s): ?Right SHOULDER ARTHROSCOPY WITH ROTATOR CUFF REPAIR ? ?Implants: ?Implant Name Type Inv. Item Serial No. Manufacturer Lot No. LRB No. Used Action  ?ANCHOR SUT FBRTK 2.6 SOFT 1.7 - [id number] Anchor ANCHOR SUT FBRTK 2.6 SOFT 1.7  [employer] [id number] Right 1 Implanted  ?ANCHOR SUT FBRTK 2.6 SP1.7 TAP - [id number] Anchor ANCHOR SUT FBRTK 2.6 SP1.7 TAP  [employer] [id number] Right 1 Implanted  ?[facility] SWIVELOCK BIO 4.75X19.1 - [id number] Anchor ANCHOR SWIVELOCK BIO 4.75X19.1  [employer] [id number] Right 1 Implanted  ?[facility] SWIVELOCK BIO 4.75X19.1 - [id number] Anchor ANCHOR SWIVELOCK BIO 4.75X19.1  [employer] [id number] Right 1 Implanted  ?ANCHOR SUT BIOCOMP LK 2.9X12.5 - [id number] Anchor ANCHOR SUT BIOCOMP LK 2.9X12.5  [employer] [id number] Right 1 Implanted  ? ? ?Surgeons: ?Surgeon(s): ?Vanetta Mulders, MD ? ?Anesthesia: ?Regional ? ? ? ?Estimated Blood Loss: ?See anesthesia record ? ?Complications: ?None ? ?Condition to PACU: ?Stable ? ?Yevonne Pax, MD ?04/03/2021 ?9:19 AM ? ?
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
## 2021-04-07 ENCOUNTER — Ambulatory Visit (HOSPITAL_BASED_OUTPATIENT_CLINIC_OR_DEPARTMENT_OTHER): Payer: BC Managed Care – PPO | Attending: Orthopaedic Surgery | Admitting: Physical Therapy

## 2021-04-07 ENCOUNTER — Other Ambulatory Visit: Payer: Self-pay

## 2021-04-07 ENCOUNTER — Encounter (HOSPITAL_BASED_OUTPATIENT_CLINIC_OR_DEPARTMENT_OTHER): Payer: Self-pay | Admitting: Physical Therapy

## 2021-04-07 DIAGNOSIS — M25511 Pain in right shoulder: Secondary | ICD-10-CM | POA: Insufficient documentation

## 2021-04-07 DIAGNOSIS — M25611 Stiffness of right shoulder, not elsewhere classified: Secondary | ICD-10-CM | POA: Diagnosis not present

## 2021-04-07 DIAGNOSIS — G8929 Other chronic pain: Secondary | ICD-10-CM | POA: Insufficient documentation

## 2021-04-07 DIAGNOSIS — M6281 Muscle weakness (generalized): Secondary | ICD-10-CM | POA: Diagnosis not present

## 2021-04-14 ENCOUNTER — Other Ambulatory Visit: Payer: Self-pay

## 2021-04-14 ENCOUNTER — Encounter (HOSPITAL_BASED_OUTPATIENT_CLINIC_OR_DEPARTMENT_OTHER): Payer: Self-pay | Admitting: Physical Therapy

## 2021-04-14 ENCOUNTER — Ambulatory Visit (HOSPITAL_BASED_OUTPATIENT_CLINIC_OR_DEPARTMENT_OTHER): Payer: BC Managed Care – PPO | Admitting: Physical Therapy

## 2021-04-14 DIAGNOSIS — G8929 Other chronic pain: Secondary | ICD-10-CM | POA: Diagnosis not present

## 2021-04-14 DIAGNOSIS — M25611 Stiffness of right shoulder, not elsewhere classified: Secondary | ICD-10-CM | POA: Diagnosis not present

## 2021-04-14 DIAGNOSIS — M6281 Muscle weakness (generalized): Secondary | ICD-10-CM | POA: Diagnosis not present

## 2021-04-14 DIAGNOSIS — M25511 Pain in right shoulder: Secondary | ICD-10-CM | POA: Diagnosis not present
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
## 2021-04-17 ENCOUNTER — Ambulatory Visit (INDEPENDENT_AMBULATORY_CARE_PROVIDER_SITE_OTHER): Payer: Self-pay | Admitting: Orthopaedic Surgery

## 2021-04-17 ENCOUNTER — Other Ambulatory Visit: Payer: Self-pay

## 2021-04-17 DIAGNOSIS — G8929 Other chronic pain: Secondary | ICD-10-CM

## 2021-04-17 DIAGNOSIS — M25561 Pain in right knee: Secondary | ICD-10-CM

## 2021-04-17 NOTE — Progress Notes (Signed)
? ?                            ? ? ?Post Operative Evaluation ?  ? ?Procedure/Date of Surgery: Shoulder rotator cuff repair done on April 03, 2021 ? ?Interval History:  ? ?Presents today 2 weeks status post right shoulder rotator cuff repair overall he is doing very well.  He has been doing PT and has had 2 sessions.  He has overall had very minimal pain.  He is not requiring any pain medication at this time. ? ? ?PMH/PSH/Family History/Social History/Meds/Allergies:   ? ?Past Medical History:  ?Diagnosis Date  ? Acute cartilage injury of knee   ? Arthritis   ? GERD (gastroesophageal reflux disease)   ? ?Past Surgical History:  ?Procedure Laterality Date  ? ELBOW SURGERY Left   ? KNEE ARTHROSCOPY Bilateral   ? KNEE SURGERY Bilateral   ? right thumb surgery    ? SHOULDER ARTHROSCOPY WITH ROTATOR CUFF REPAIR Right 04/03/2021  ? Procedure: Right SHOULDER ARTHROSCOPY WITH ROTATOR CUFF REPAIR;  Surgeon: Vanetta Mulders, MD;  Location: [place];  Service: Orthopedics;  Laterality: Right;  ? thumb surgery Right   ? ?Social History  ? ?Socioeconomic History  ? Marital status: Divorced  ?  Spouse name: Not on file  ? Number of children: Not on file  ? Years of education: Not on file  ? Highest education level: Not on file  ?Occupational History  ? Not on file  ?Tobacco Use  ? Smoking status: Never  ? Smokeless tobacco: Never  ?Vaping Use  ? Vaping Use: Never used  ?Substance and Sexual Activity  ? Alcohol use: Yes  ?  Comment: occassionally  ? Drug use: Never  ? Sexual activity: Yes  ?  Partners: Female  ?Other Topics Concern  ? Not on file  ?Social History Narrative  ? Not on file  ? ?Social Determinants of Health  ? ?Financial Resource Strain: Not on file  ?Food Insecurity: Not on file  ?Transportation Needs: Not on file  ?Physical Activity: Not on file  ?Stress: Not on file  ?Social Connections: Not on file  ? ?Family History  ?Problem Relation Age of Onset  ? Diabetes Mother   ? Hypertension Mother   ? Asthma Mother   ?  Cancer Father   ?     bladder cancer  ? ?No Known Allergies ?Current Outpatient Medications  ?Medication Sig Dispense Refill  ? aspirin EC 325 MG tablet Take 1 tablet (325 mg total) by mouth daily. (Patient not taking: Reported on 03/20/2021) 30 tablet 0  ? benzonatate (TESSALON) 100 MG capsule Take 1-2 capsules (100-200 mg total) by mouth 3 (three) times daily as needed for cough. (Patient not taking: Reported on 03/20/2021) 60 capsule 0  ? cetirizine (ZYRTEC ALLERGY) 10 MG tablet Take 1 tablet (10 mg total) by mouth daily. (Patient not taking: Reported on 03/20/2021) 30 tablet 0  ? Esomeprazole Magnesium (NEXIUM PO) Take by mouth.    ? ondansetron (ZOFRAN-ODT) 8 MG disintegrating tablet Take 1 tablet (8 mg total) by mouth every 8 (eight) hours as needed for nausea or vomiting. (Patient not taking: Reported on 04/07/2021) 10 tablet 0  ? oxyCODONE (OXY IR/ROXICODONE) 5 MG immediate release tablet Take 1 tablet (5 mg total) by mouth every 4 (four) hours as needed (severe pain). (Patient not taking: Reported on 03/20/2021) 20 tablet 0  ? promethazine-dextromethorphan (PROMETHAZINE-DM) 6.25-15 MG/5ML syrup Take 5  mLs by mouth at bedtime as needed for cough. (Patient not taking: Reported on 03/20/2021) 100 mL 0  ? pseudoephedrine (SUDAFED) 60 MG tablet Take 1 tablet (60 mg total) by mouth every 8 (eight) hours as needed for congestion. (Patient not taking: Reported on 03/20/2021) 30 tablet 0  ? ?No current facility-administered medications for this visit.  ? ?No results found. ? ?Review of Systems:   ?A ROS was performed including pertinent positives and negatives as documented in the HPI. ? ? ?Musculoskeletal Exam:   ? ?There were no vitals taken for this visit. ? ?Right shoulder incisions are well-healing.  Sensation is intact over the deltoid.  Able to fire and flex his elbow flexors and extensors.  Fires wrist extensors and flexors.  2+ radial pulse ? ?Imaging:   ? ?None ? ?I personally reviewed and interpreted the  radiographs. ? ? ?Assessment:   ?2-week status post right shoulder rotator cuff repair.  He will continue to advance per protocol. ? ?Plan :   ? ?-Return to clinic in 4 weeks ? ? ? ? ?I personally saw and evaluated the patient, and participated in the management and treatment plan. ? ?Vanetta Mulders, MD ?Attending Physician, Orthopedic Surgery ? ?This document was dictated using Systems analyst. A reasonable attempt at proof reading has been made to minimize errors. ?

## 2021-04-21 ENCOUNTER — Encounter (HOSPITAL_BASED_OUTPATIENT_CLINIC_OR_DEPARTMENT_OTHER): Payer: Self-pay | Admitting: Physical Therapy

## 2021-04-21 ENCOUNTER — Ambulatory Visit (HOSPITAL_BASED_OUTPATIENT_CLINIC_OR_DEPARTMENT_OTHER): Payer: BC Managed Care – PPO | Admitting: Physical Therapy

## 2021-04-21 ENCOUNTER — Other Ambulatory Visit: Payer: Self-pay

## 2021-04-21 DIAGNOSIS — M25611 Stiffness of right shoulder, not elsewhere classified: Secondary | ICD-10-CM | POA: Diagnosis not present

## 2021-04-21 DIAGNOSIS — G8929 Other chronic pain: Secondary | ICD-10-CM | POA: Diagnosis not present

## 2021-04-21 DIAGNOSIS — M6281 Muscle weakness (generalized): Secondary | ICD-10-CM | POA: Diagnosis not present

## 2021-04-21 DIAGNOSIS — M25511 Pain in right shoulder: Secondary | ICD-10-CM
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
## 2021-04-28 ENCOUNTER — Encounter (HOSPITAL_BASED_OUTPATIENT_CLINIC_OR_DEPARTMENT_OTHER): Payer: Self-pay | Admitting: Physical Therapy

## 2021-04-28 ENCOUNTER — Other Ambulatory Visit: Payer: Self-pay

## 2021-04-28 ENCOUNTER — Ambulatory Visit (HOSPITAL_BASED_OUTPATIENT_CLINIC_OR_DEPARTMENT_OTHER): Payer: BC Managed Care – PPO | Admitting: Physical Therapy

## 2021-04-28 DIAGNOSIS — M25511 Pain in right shoulder: Secondary | ICD-10-CM | POA: Diagnosis not present

## 2021-04-28 DIAGNOSIS — M25611 Stiffness of right shoulder, not elsewhere classified: Secondary | ICD-10-CM

## 2021-04-28 DIAGNOSIS — M6281 Muscle weakness (generalized): Secondary | ICD-10-CM

## 2021-04-28 DIAGNOSIS — G8929 Other chronic pain: Secondary | ICD-10-CM | POA: Diagnosis not present
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
## 2021-05-05 ENCOUNTER — Encounter (HOSPITAL_BASED_OUTPATIENT_CLINIC_OR_DEPARTMENT_OTHER): Payer: Self-pay | Admitting: Physical Therapy

## 2021-05-05 ENCOUNTER — Ambulatory Visit (HOSPITAL_BASED_OUTPATIENT_CLINIC_OR_DEPARTMENT_OTHER): Payer: BC Managed Care – PPO | Attending: Orthopaedic Surgery | Admitting: Physical Therapy

## 2021-05-05 DIAGNOSIS — M25572 Pain in left ankle and joints of left foot: Secondary | ICD-10-CM | POA: Diagnosis not present

## 2021-05-05 DIAGNOSIS — M722 Plantar fascial fibromatosis: Secondary | ICD-10-CM | POA: Insufficient documentation

## 2021-05-05 DIAGNOSIS — M25561 Pain in right knee: Secondary | ICD-10-CM | POA: Diagnosis not present

## 2021-05-05 DIAGNOSIS — M1711 Unilateral primary osteoarthritis, right knee: Secondary | ICD-10-CM | POA: Diagnosis not present

## 2021-05-05 DIAGNOSIS — S46011D Strain of muscle(s) and tendon(s) of the rotator cuff of right shoulder, subsequent encounter: Secondary | ICD-10-CM | POA: Insufficient documentation

## 2021-05-05 DIAGNOSIS — M6281 Muscle weakness (generalized): Secondary | ICD-10-CM | POA: Insufficient documentation

## 2021-05-05 DIAGNOSIS — M25511 Pain in right shoulder: Secondary | ICD-10-CM | POA: Insufficient documentation

## 2021-05-05 DIAGNOSIS — M25611 Stiffness of right shoulder, not elsewhere classified: Secondary | ICD-10-CM | POA: Insufficient documentation

## 2021-05-05 DIAGNOSIS — G8929 Other chronic pain: Secondary | ICD-10-CM | POA: Diagnosis not present

## 2021-05-14 ENCOUNTER — Encounter (HOSPITAL_BASED_OUTPATIENT_CLINIC_OR_DEPARTMENT_OTHER): Payer: Self-pay | Admitting: Physical Therapy

## 2021-05-14 ENCOUNTER — Ambulatory Visit (HOSPITAL_BASED_OUTPATIENT_CLINIC_OR_DEPARTMENT_OTHER): Payer: BC Managed Care – PPO | Admitting: Physical Therapy

## 2021-05-14 DIAGNOSIS — M25611 Stiffness of right shoulder, not elsewhere classified: Secondary | ICD-10-CM

## 2021-05-14 DIAGNOSIS — S46011D Strain of muscle(s) and tendon(s) of the rotator cuff of right shoulder, subsequent encounter: Secondary | ICD-10-CM | POA: Diagnosis not present

## 2021-05-14 DIAGNOSIS — M25511 Pain in right shoulder: Secondary | ICD-10-CM

## 2021-05-14 DIAGNOSIS — M722 Plantar fascial fibromatosis: Secondary | ICD-10-CM | POA: Diagnosis not present

## 2021-05-14 DIAGNOSIS — M6281 Muscle weakness (generalized): Secondary | ICD-10-CM

## 2021-05-14 DIAGNOSIS — M25572 Pain in left ankle and joints of left foot: Secondary | ICD-10-CM | POA: Diagnosis not present

## 2021-05-14 DIAGNOSIS — M25561 Pain in right knee: Secondary | ICD-10-CM | POA: Diagnosis not present

## 2021-05-14 DIAGNOSIS — G8929 Other chronic pain: Secondary | ICD-10-CM | POA: Diagnosis not present

## 2021-05-14 DIAGNOSIS — M1711 Unilateral primary osteoarthritis, right knee: Secondary | ICD-10-CM | POA: Diagnosis not present
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
## 2021-05-15 ENCOUNTER — Ambulatory Visit (INDEPENDENT_AMBULATORY_CARE_PROVIDER_SITE_OTHER): Payer: BC Managed Care – PPO | Admitting: Orthopaedic Surgery

## 2021-05-15 DIAGNOSIS — M1711 Unilateral primary osteoarthritis, right knee: Secondary | ICD-10-CM | POA: Diagnosis not present

## 2021-05-15 DIAGNOSIS — S46011D Strain of muscle(s) and tendon(s) of the rotator cuff of right shoulder, subsequent encounter: Secondary | ICD-10-CM

## 2021-05-15 DIAGNOSIS — M722 Plantar fascial fibromatosis: Secondary | ICD-10-CM

## 2021-05-15 NOTE — Progress Notes (Signed)
? ?                            ? ? ?Post Operative Evaluation ?  ? ?Procedure/Date of Surgery: Shoulder rotator cuff repair done on April 03, 2021 ? ?Interval History:  ? ?Presents today for 6-week follow-up of his right shoulder rotator cuff repair.  Overall he is doing very well.  He has been working on active range of motion which is going quite well.  He is attempting to be more active and is now running up to 3 times a week multiple miles which is causing him more medial based knee pain.  He states that the injections that he provided in the right knee previously worn off.  He is also having some pain at the left heel as he has been more active ? ? ?PMH/PSH/Family History/Social History/Meds/Allergies:   ? ?Past Medical History:  ?Diagnosis Date  ? Acute cartilage injury of knee   ? Arthritis   ? GERD (gastroesophageal reflux disease)   ? ?Past Surgical History:  ?Procedure Laterality Date  ? ELBOW SURGERY Left   ? KNEE ARTHROSCOPY Bilateral   ? KNEE SURGERY Bilateral   ? right thumb surgery    ? SHOULDER ARTHROSCOPY WITH ROTATOR CUFF REPAIR Right 04/03/2021  ? Procedure: Right SHOULDER ARTHROSCOPY WITH ROTATOR CUFF REPAIR;  Surgeon: Vanetta Mulders, MD;  Location: [place];  Service: Orthopedics;  Laterality: Right;  ? thumb surgery Right   ? ?Social History  ? ?Socioeconomic History  ? Marital status: Divorced  ?  Spouse name: Not on file  ? Number of children: Not on file  ? Years of education: Not on file  ? Highest education level: Not on file  ?Occupational History  ? Not on file  ?Tobacco Use  ? Smoking status: Never  ? Smokeless tobacco: Never  ?Vaping Use  ? Vaping Use: Never used  ?Substance and Sexual Activity  ? Alcohol use: Yes  ?  Comment: occassionally  ? Drug use: Never  ? Sexual activity: Yes  ?  Partners: Female  ?Other Topics Concern  ? Not on file  ?Social History Narrative  ? Not on file  ? ?Social Determinants of Health  ? ?Financial Resource Strain: Not on file  ?Food Insecurity: Not on file   ?Transportation Needs: Not on file  ?Physical Activity: Not on file  ?Stress: Not on file  ?Social Connections: Not on file  ? ?Family History  ?Problem Relation Age of Onset  ? Diabetes Mother   ? Hypertension Mother   ? Asthma Mother   ? Cancer Father   ?     bladder cancer  ? ?No Known Allergies ?Current Outpatient Medications  ?Medication Sig Dispense Refill  ? aspirin EC 325 MG tablet Take 1 tablet (325 mg total) by mouth daily. (Patient not taking: Reported on 03/20/2021) 30 tablet 0  ? benzonatate (TESSALON) 100 MG capsule Take 1-2 capsules (100-200 mg total) by mouth 3 (three) times daily as needed for cough. (Patient not taking: Reported on 03/20/2021) 60 capsule 0  ? cetirizine (ZYRTEC ALLERGY) 10 MG tablet Take 1 tablet (10 mg total) by mouth daily. (Patient not taking: Reported on 03/20/2021) 30 tablet 0  ? Esomeprazole Magnesium (NEXIUM PO) Take by mouth.    ? ondansetron (ZOFRAN-ODT) 8 MG disintegrating tablet Take 1 tablet (8 mg total) by mouth every 8 (eight) hours as needed for nausea or vomiting. (Patient not taking: Reported  on 04/07/2021) 10 tablet 0  ? oxyCODONE (OXY IR/ROXICODONE) 5 MG immediate release tablet Take 1 tablet (5 mg total) by mouth every 4 (four) hours as needed (severe pain). (Patient not taking: Reported on 03/20/2021) 20 tablet 0  ? promethazine-dextromethorphan (PROMETHAZINE-DM) 6.25-15 MG/5ML syrup Take 5 mLs by mouth at bedtime as needed for cough. (Patient not taking: Reported on 03/20/2021) 100 mL 0  ? pseudoephedrine (SUDAFED) 60 MG tablet Take 1 tablet (60 mg total) by mouth every 8 (eight) hours as needed for congestion. (Patient not taking: Reported on 03/20/2021) 30 tablet 0  ? ?No current facility-administered medications for this visit.  ? ?No results found. ? ?Review of Systems:   ?A ROS was performed including pertinent positives and negatives as documented in the HPI. ? ? ?Musculoskeletal Exam:   ? ?There were no vitals taken for this visit. ? ?Right shoulder incisions  are well-healing.  Sensation is intact over the deltoid.  Able to fire and flex his elbow flexors and extensors.  Forward elevation actively is to 140 degrees, external rotation at side is to 70 degrees.  Fires wrist extensors and flexors.  2+ radial pulse ? ?Tenderness palpation about the left medial heel as well as right medial joint space ? ? ?Imaging:   ? ?None ? ?I personally reviewed and interpreted the radiographs. ? ? ?Assessment:   ?6 weeks status post right shoulder rotator cuff repair overall doing extremely well.  May continue to advance with active range of motion.  With regard to his right knee I would also like him to participate in physical therapy for some hip and quad strengthening in order to optimize the right knee.  I described that should he not feel better with this we can consider an MRI at the next visit.  With regard to the left heel I do believe it would be worth part of an appointment with physical therapy to work on Achilles stretching program as well. ?Plan :   ? ?-Return to clinic in 4 weeks ? ? ? ? ?I personally saw and evaluated the patient, and participated in the management and treatment plan. ? ?Vanetta Mulders, MD ?Attending Physician, Orthopedic Surgery ? ?This document was dictated using Systems analyst. A reasonable attempt at proof reading has been made to minimize errors. ?

## 2021-05-16 ENCOUNTER — Encounter (HOSPITAL_BASED_OUTPATIENT_CLINIC_OR_DEPARTMENT_OTHER): Payer: Self-pay | Admitting: Physical Therapy

## 2021-05-16 ENCOUNTER — Ambulatory Visit (HOSPITAL_BASED_OUTPATIENT_CLINIC_OR_DEPARTMENT_OTHER): Payer: BC Managed Care – PPO | Admitting: Physical Therapy

## 2021-05-16 DIAGNOSIS — M25511 Pain in right shoulder: Secondary | ICD-10-CM | POA: Diagnosis not present

## 2021-05-16 DIAGNOSIS — M6281 Muscle weakness (generalized): Secondary | ICD-10-CM

## 2021-05-16 DIAGNOSIS — M25611 Stiffness of right shoulder, not elsewhere classified: Secondary | ICD-10-CM | POA: Diagnosis not present

## 2021-05-16 DIAGNOSIS — M25561 Pain in right knee: Secondary | ICD-10-CM | POA: Diagnosis not present

## 2021-05-16 DIAGNOSIS — M25572 Pain in left ankle and joints of left foot: Secondary | ICD-10-CM | POA: Diagnosis not present

## 2021-05-16 DIAGNOSIS — G8929 Other chronic pain: Secondary | ICD-10-CM

## 2021-05-16 DIAGNOSIS — M1711 Unilateral primary osteoarthritis, right knee: Secondary | ICD-10-CM | POA: Diagnosis not present

## 2021-05-16 DIAGNOSIS — S46011D Strain of muscle(s) and tendon(s) of the rotator cuff of right shoulder, subsequent encounter: Secondary | ICD-10-CM | POA: Diagnosis not present

## 2021-05-16 DIAGNOSIS — M722 Plantar fascial fibromatosis: Secondary | ICD-10-CM | POA: Diagnosis not present
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
## 2021-05-21 ENCOUNTER — Encounter (HOSPITAL_BASED_OUTPATIENT_CLINIC_OR_DEPARTMENT_OTHER): Payer: Self-pay | Admitting: Physical Therapy
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
## 2021-05-23 ENCOUNTER — Ambulatory Visit (HOSPITAL_BASED_OUTPATIENT_CLINIC_OR_DEPARTMENT_OTHER): Payer: BC Managed Care – PPO | Admitting: Physical Therapy

## 2021-05-23 ENCOUNTER — Encounter (HOSPITAL_BASED_OUTPATIENT_CLINIC_OR_DEPARTMENT_OTHER): Payer: Self-pay | Admitting: Physical Therapy

## 2021-05-23 DIAGNOSIS — M25511 Pain in right shoulder: Secondary | ICD-10-CM

## 2021-05-23 DIAGNOSIS — M6281 Muscle weakness (generalized): Secondary | ICD-10-CM | POA: Diagnosis not present

## 2021-05-23 DIAGNOSIS — S46011D Strain of muscle(s) and tendon(s) of the rotator cuff of right shoulder, subsequent encounter: Secondary | ICD-10-CM | POA: Diagnosis not present

## 2021-05-23 DIAGNOSIS — M25572 Pain in left ankle and joints of left foot: Secondary | ICD-10-CM | POA: Diagnosis not present

## 2021-05-23 DIAGNOSIS — M722 Plantar fascial fibromatosis: Secondary | ICD-10-CM | POA: Diagnosis not present

## 2021-05-23 DIAGNOSIS — M25611 Stiffness of right shoulder, not elsewhere classified: Secondary | ICD-10-CM | POA: Diagnosis not present

## 2021-05-23 DIAGNOSIS — M25561 Pain in right knee: Secondary | ICD-10-CM | POA: Diagnosis not present

## 2021-05-23 DIAGNOSIS — G8929 Other chronic pain: Secondary | ICD-10-CM | POA: Diagnosis not present

## 2021-05-23 DIAGNOSIS — M1711 Unilateral primary osteoarthritis, right knee: Secondary | ICD-10-CM | POA: Diagnosis not present
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
## 2021-05-28 ENCOUNTER — Encounter (HOSPITAL_BASED_OUTPATIENT_CLINIC_OR_DEPARTMENT_OTHER): Payer: Self-pay | Admitting: Physical Therapy

## 2021-05-28 ENCOUNTER — Ambulatory Visit (HOSPITAL_BASED_OUTPATIENT_CLINIC_OR_DEPARTMENT_OTHER): Payer: BC Managed Care – PPO | Admitting: Physical Therapy

## 2021-05-28 DIAGNOSIS — G8929 Other chronic pain: Secondary | ICD-10-CM | POA: Diagnosis not present

## 2021-05-28 DIAGNOSIS — M722 Plantar fascial fibromatosis: Secondary | ICD-10-CM | POA: Diagnosis not present

## 2021-05-28 DIAGNOSIS — M1711 Unilateral primary osteoarthritis, right knee: Secondary | ICD-10-CM | POA: Diagnosis not present

## 2021-05-28 DIAGNOSIS — M6281 Muscle weakness (generalized): Secondary | ICD-10-CM

## 2021-05-28 DIAGNOSIS — M25611 Stiffness of right shoulder, not elsewhere classified: Secondary | ICD-10-CM

## 2021-05-28 DIAGNOSIS — M25572 Pain in left ankle and joints of left foot: Secondary | ICD-10-CM | POA: Diagnosis not present

## 2021-05-28 DIAGNOSIS — M25511 Pain in right shoulder: Secondary | ICD-10-CM

## 2021-05-28 DIAGNOSIS — S46011D Strain of muscle(s) and tendon(s) of the rotator cuff of right shoulder, subsequent encounter: Secondary | ICD-10-CM | POA: Diagnosis not present

## 2021-05-28 DIAGNOSIS — M25561 Pain in right knee: Secondary | ICD-10-CM | POA: Diagnosis not present

## 2021-05-30 ENCOUNTER — Encounter (HOSPITAL_BASED_OUTPATIENT_CLINIC_OR_DEPARTMENT_OTHER): Payer: Self-pay | Admitting: Physical Therapy

## 2021-05-30 ENCOUNTER — Ambulatory Visit (HOSPITAL_BASED_OUTPATIENT_CLINIC_OR_DEPARTMENT_OTHER): Payer: BC Managed Care – PPO | Admitting: Physical Therapy

## 2021-05-30 DIAGNOSIS — G8929 Other chronic pain: Secondary | ICD-10-CM | POA: Diagnosis not present

## 2021-05-30 DIAGNOSIS — M25572 Pain in left ankle and joints of left foot: Secondary | ICD-10-CM | POA: Diagnosis not present

## 2021-05-30 DIAGNOSIS — M6281 Muscle weakness (generalized): Secondary | ICD-10-CM

## 2021-05-30 DIAGNOSIS — M25611 Stiffness of right shoulder, not elsewhere classified: Secondary | ICD-10-CM | POA: Diagnosis not present

## 2021-05-30 DIAGNOSIS — M25511 Pain in right shoulder: Secondary | ICD-10-CM

## 2021-05-30 DIAGNOSIS — M1711 Unilateral primary osteoarthritis, right knee: Secondary | ICD-10-CM | POA: Diagnosis not present

## 2021-05-30 DIAGNOSIS — M722 Plantar fascial fibromatosis: Secondary | ICD-10-CM | POA: Diagnosis not present

## 2021-05-30 DIAGNOSIS — S46011D Strain of muscle(s) and tendon(s) of the rotator cuff of right shoulder, subsequent encounter: Secondary | ICD-10-CM | POA: Diagnosis not present

## 2021-05-30 DIAGNOSIS — M25561 Pain in right knee: Secondary | ICD-10-CM | POA: Diagnosis not present
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
## 2021-06-04 ENCOUNTER — Ambulatory Visit (HOSPITAL_BASED_OUTPATIENT_CLINIC_OR_DEPARTMENT_OTHER): Payer: BC Managed Care – PPO | Attending: Orthopaedic Surgery | Admitting: Physical Therapy

## 2021-06-04 ENCOUNTER — Encounter (HOSPITAL_BASED_OUTPATIENT_CLINIC_OR_DEPARTMENT_OTHER): Payer: Self-pay | Admitting: Physical Therapy

## 2021-06-04 DIAGNOSIS — M25611 Stiffness of right shoulder, not elsewhere classified: Secondary | ICD-10-CM | POA: Diagnosis not present

## 2021-06-04 DIAGNOSIS — M25561 Pain in right knee: Secondary | ICD-10-CM | POA: Insufficient documentation

## 2021-06-04 DIAGNOSIS — M6281 Muscle weakness (generalized): Secondary | ICD-10-CM | POA: Insufficient documentation

## 2021-06-04 DIAGNOSIS — M25511 Pain in right shoulder: Secondary | ICD-10-CM | POA: Insufficient documentation

## 2021-06-04 DIAGNOSIS — G8929 Other chronic pain: Secondary | ICD-10-CM | POA: Insufficient documentation

## 2021-06-04 DIAGNOSIS — M25572 Pain in left ankle and joints of left foot: Secondary | ICD-10-CM | POA: Diagnosis not present
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
## 2021-06-06 ENCOUNTER — Encounter (HOSPITAL_BASED_OUTPATIENT_CLINIC_OR_DEPARTMENT_OTHER): Payer: Self-pay | Admitting: Physical Therapy

## 2021-06-06 ENCOUNTER — Ambulatory Visit (HOSPITAL_BASED_OUTPATIENT_CLINIC_OR_DEPARTMENT_OTHER): Payer: BC Managed Care – PPO | Admitting: Physical Therapy

## 2021-06-06 DIAGNOSIS — M25511 Pain in right shoulder: Secondary | ICD-10-CM

## 2021-06-06 DIAGNOSIS — M6281 Muscle weakness (generalized): Secondary | ICD-10-CM | POA: Diagnosis not present

## 2021-06-06 DIAGNOSIS — G8929 Other chronic pain: Secondary | ICD-10-CM | POA: Diagnosis not present

## 2021-06-06 DIAGNOSIS — M25572 Pain in left ankle and joints of left foot: Secondary | ICD-10-CM | POA: Diagnosis not present

## 2021-06-06 DIAGNOSIS — M25611 Stiffness of right shoulder, not elsewhere classified: Secondary | ICD-10-CM | POA: Diagnosis not present

## 2021-06-06 DIAGNOSIS — M25561 Pain in right knee: Secondary | ICD-10-CM | POA: Diagnosis not present

## 2021-06-11 ENCOUNTER — Encounter (HOSPITAL_BASED_OUTPATIENT_CLINIC_OR_DEPARTMENT_OTHER): Payer: Self-pay | Admitting: Physical Therapy

## 2021-06-11 ENCOUNTER — Ambulatory Visit (HOSPITAL_BASED_OUTPATIENT_CLINIC_OR_DEPARTMENT_OTHER): Payer: BC Managed Care – PPO | Admitting: Physical Therapy

## 2021-06-11 DIAGNOSIS — M25511 Pain in right shoulder: Secondary | ICD-10-CM

## 2021-06-11 DIAGNOSIS — G8929 Other chronic pain: Secondary | ICD-10-CM | POA: Diagnosis not present

## 2021-06-11 DIAGNOSIS — M25561 Pain in right knee: Secondary | ICD-10-CM | POA: Diagnosis not present

## 2021-06-11 DIAGNOSIS — M25611 Stiffness of right shoulder, not elsewhere classified: Secondary | ICD-10-CM | POA: Diagnosis not present

## 2021-06-11 DIAGNOSIS — M25572 Pain in left ankle and joints of left foot: Secondary | ICD-10-CM | POA: Diagnosis not present

## 2021-06-11 DIAGNOSIS — M6281 Muscle weakness (generalized): Secondary | ICD-10-CM | POA: Diagnosis not present
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
## 2021-06-13 ENCOUNTER — Ambulatory Visit (INDEPENDENT_AMBULATORY_CARE_PROVIDER_SITE_OTHER): Payer: BC Managed Care – PPO | Admitting: Orthopaedic Surgery

## 2021-06-13 ENCOUNTER — Encounter (HOSPITAL_BASED_OUTPATIENT_CLINIC_OR_DEPARTMENT_OTHER): Payer: Self-pay | Admitting: Physical Therapy

## 2021-06-13 ENCOUNTER — Ambulatory Visit (HOSPITAL_BASED_OUTPATIENT_CLINIC_OR_DEPARTMENT_OTHER): Payer: BC Managed Care – PPO | Admitting: Physical Therapy

## 2021-06-13 ENCOUNTER — Ambulatory Visit (HOSPITAL_BASED_OUTPATIENT_CLINIC_OR_DEPARTMENT_OTHER): Payer: BC Managed Care – PPO

## 2021-06-13 DIAGNOSIS — M25611 Stiffness of right shoulder, not elsewhere classified: Secondary | ICD-10-CM

## 2021-06-13 DIAGNOSIS — G8929 Other chronic pain: Secondary | ICD-10-CM

## 2021-06-13 DIAGNOSIS — M6281 Muscle weakness (generalized): Secondary | ICD-10-CM

## 2021-06-13 DIAGNOSIS — M25511 Pain in right shoulder: Secondary | ICD-10-CM | POA: Diagnosis not present

## 2021-06-13 DIAGNOSIS — M25561 Pain in right knee: Secondary | ICD-10-CM

## 2021-06-13 DIAGNOSIS — M25572 Pain in left ankle and joints of left foot: Secondary | ICD-10-CM

## 2021-06-13 NOTE — Progress Notes (Signed)
? ?                            ? ? ?Post Operative Evaluation ?  ? ?Procedure/Date of Surgery: Shoulder rotator cuff repair done on April 03, 2021 ? ?Interval History:  ? ?Presents today for follow-up of his right shoulder rotator cuff repair.  Overall this feels much better.  He is improving dramatically.  His range of motion is improving significantly as well as his strength.  With regard to the right knee he does feel better with physical therapy although he does continue to have certainly cannot medial joint space pain. ? ? ?PMH/PSH/Family History/Social History/Meds/Allergies:   ? ?Past Medical History:  ?Diagnosis Date  ? Acute cartilage injury of knee   ? Arthritis   ? GERD (gastroesophageal reflux disease)   ? ?Past Surgical History:  ?Procedure Laterality Date  ? ELBOW SURGERY Left   ? KNEE ARTHROSCOPY Bilateral   ? KNEE SURGERY Bilateral   ? right thumb surgery    ? SHOULDER ARTHROSCOPY WITH ROTATOR CUFF REPAIR Right 04/03/2021  ? Procedure: Right SHOULDER ARTHROSCOPY WITH ROTATOR CUFF REPAIR;  Surgeon: Vanetta Mulders, MD;  Location: [place];  Service: Orthopedics;  Laterality: Right;  ? thumb surgery Right   ? ?Social History  ? ?Socioeconomic History  ? Marital status: Divorced  ?  Spouse name: Not on file  ? Number of children: Not on file  ? Years of education: Not on file  ? Highest education level: Not on file  ?Occupational History  ? Not on file  ?Tobacco Use  ? Smoking status: Never  ? Smokeless tobacco: Never  ?Vaping Use  ? Vaping Use: Never used  ?Substance and Sexual Activity  ? Alcohol use: Yes  ?  Comment: occassionally  ? Drug use: Never  ? Sexual activity: Yes  ?  Partners: Female  ?Other Topics Concern  ? Not on file  ?Social History Narrative  ? Not on file  ? ?Social Determinants of Health  ? ?Financial Resource Strain: Not on file  ?Food Insecurity: Not on file  ?Transportation Needs: Not on file  ?Physical Activity: Not on file  ?Stress: Not on file  ?Social Connections: Not on file   ? ?Family History  ?Problem Relation Age of Onset  ? Diabetes Mother   ? Hypertension Mother   ? Asthma Mother   ? Cancer Father   ?     bladder cancer  ? ?No Known Allergies ?Current Outpatient Medications  ?Medication Sig Dispense Refill  ? aspirin EC 325 MG tablet Take 1 tablet (325 mg total) by mouth daily. (Patient not taking: Reported on 03/20/2021) 30 tablet 0  ? benzonatate (TESSALON) 100 MG capsule Take 1-2 capsules (100-200 mg total) by mouth 3 (three) times daily as needed for cough. (Patient not taking: Reported on 03/20/2021) 60 capsule 0  ? cetirizine (ZYRTEC ALLERGY) 10 MG tablet Take 1 tablet (10 mg total) by mouth daily. (Patient not taking: Reported on 03/20/2021) 30 tablet 0  ? Esomeprazole Magnesium (NEXIUM PO) Take by mouth.    ? ondansetron (ZOFRAN-ODT) 8 MG disintegrating tablet Take 1 tablet (8 mg total) by mouth every 8 (eight) hours as needed for nausea or vomiting. (Patient not taking: Reported on 04/07/2021) 10 tablet 0  ? oxyCODONE (OXY IR/ROXICODONE) 5 MG immediate release tablet Take 1 tablet (5 mg total) by mouth every 4 (four) hours as needed (severe pain). (Patient not taking: Reported  on 03/20/2021) 20 tablet 0  ? promethazine-dextromethorphan (PROMETHAZINE-DM) 6.25-15 MG/5ML syrup Take 5 mLs by mouth at bedtime as needed for cough. (Patient not taking: Reported on 03/20/2021) 100 mL 0  ? pseudoephedrine (SUDAFED) 60 MG tablet Take 1 tablet (60 mg total) by mouth every 8 (eight) hours as needed for congestion. (Patient not taking: Reported on 03/20/2021) 30 tablet 0  ? ?No current facility-administered medications for this visit.  ? ?No results found. ? ?Review of Systems:   ?A ROS was performed including pertinent positives and negatives as documented in the HPI. ? ? ?Musculoskeletal Exam:   ? ?There were no vitals taken for this visit. ? ?Right shoulder incisions are well-healing.  Sensation is intact over the deltoid.  Able to fire and flex his elbow flexors and extensors.  Forward  elevation actively is to 160 degrees, external rotation at side is to 70 degrees.  Fires wrist extensors and flexors.  2+ radial pulse ? ?With regard to the right knee.  There is tenderness about the medial joint space.  Positive McMurray medially.  Range of motion is from 0-135 with minimal crepitus.  Positive effusion.  Which is trace ? ? ?Imaging:   ? ?None ? ?I personally reviewed and interpreted the radiographs. ? ? ?Assessment:   ?10 weeks status post right shoulder rotator cuff repair overall doing extremely well.   He continues to make improvement with physical therapy with regard to the right knee.  Overall he does still have persistent medial joint line tenderness.  This is failed multiple injections at this time.  At this time I do believe an MRI of the right knee is necessary in order to further assess his underlying cartilage status and to assess for any type of meniscal injury ?Plan :   ? ?-Return to clinic in 4 weeks ?-Plan for MRI right knee ?-For full-length alignment view at the next visit ? ?I believe that advance imaging in the form of an MRI is indicated for the following reasons: ?-Xrays images were obtained and not diagnostic ?-The patient has failed treatment modalities including rest, activity restriction, NSAIDs, 3 steroid injections, physical therapy ?-The following worrisome symptoms are present on history and exam: Pain over medial joint space with positive McMurray ?- MRI is required to assist in specific surgical planning  ? ? ? ? ? ?I personally saw and evaluated the patient, and participated in the management and treatment plan. ? ?Vanetta Mulders, MD ?Attending Physician, Orthopedic Surgery ? ?This document was dictated using Systems analyst. A reasonable attempt at proof reading has been made to minimize errors. ?
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
## 2021-06-18 ENCOUNTER — Ambulatory Visit (HOSPITAL_BASED_OUTPATIENT_CLINIC_OR_DEPARTMENT_OTHER): Payer: BC Managed Care – PPO | Admitting: Physical Therapy

## 2021-06-20 ENCOUNTER — Encounter (HOSPITAL_BASED_OUTPATIENT_CLINIC_OR_DEPARTMENT_OTHER): Payer: Self-pay | Admitting: Physical Therapy
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
## 2021-06-25 ENCOUNTER — Ambulatory Visit (HOSPITAL_BASED_OUTPATIENT_CLINIC_OR_DEPARTMENT_OTHER): Payer: BC Managed Care – PPO | Admitting: Physical Therapy

## 2021-06-25 ENCOUNTER — Encounter (HOSPITAL_BASED_OUTPATIENT_CLINIC_OR_DEPARTMENT_OTHER): Payer: Self-pay | Admitting: Physical Therapy

## 2021-06-25 DIAGNOSIS — M25611 Stiffness of right shoulder, not elsewhere classified: Secondary | ICD-10-CM | POA: Diagnosis not present

## 2021-06-25 DIAGNOSIS — M6281 Muscle weakness (generalized): Secondary | ICD-10-CM | POA: Diagnosis not present

## 2021-06-25 DIAGNOSIS — M25511 Pain in right shoulder: Secondary | ICD-10-CM

## 2021-06-25 DIAGNOSIS — G8929 Other chronic pain: Secondary | ICD-10-CM | POA: Diagnosis not present

## 2021-06-25 DIAGNOSIS — M25572 Pain in left ankle and joints of left foot: Secondary | ICD-10-CM | POA: Diagnosis not present

## 2021-06-25 DIAGNOSIS — M25561 Pain in right knee: Secondary | ICD-10-CM | POA: Diagnosis not present

## 2021-06-27 ENCOUNTER — Encounter (HOSPITAL_BASED_OUTPATIENT_CLINIC_OR_DEPARTMENT_OTHER): Payer: Self-pay | Admitting: Physical Therapy

## 2021-06-27 ENCOUNTER — Ambulatory Visit (HOSPITAL_BASED_OUTPATIENT_CLINIC_OR_DEPARTMENT_OTHER): Payer: BC Managed Care – PPO | Admitting: Physical Therapy

## 2021-06-27 DIAGNOSIS — M6281 Muscle weakness (generalized): Secondary | ICD-10-CM

## 2021-06-27 DIAGNOSIS — M25572 Pain in left ankle and joints of left foot: Secondary | ICD-10-CM

## 2021-06-27 DIAGNOSIS — M25511 Pain in right shoulder: Secondary | ICD-10-CM

## 2021-06-27 DIAGNOSIS — M25561 Pain in right knee: Secondary | ICD-10-CM | POA: Diagnosis not present

## 2021-06-27 DIAGNOSIS — G8929 Other chronic pain: Secondary | ICD-10-CM | POA: Diagnosis not present

## 2021-06-27 DIAGNOSIS — M25611 Stiffness of right shoulder, not elsewhere classified: Secondary | ICD-10-CM
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
## 2021-07-02 ENCOUNTER — Encounter (HOSPITAL_BASED_OUTPATIENT_CLINIC_OR_DEPARTMENT_OTHER): Payer: Self-pay | Admitting: Physical Therapy

## 2021-07-02 ENCOUNTER — Ambulatory Visit (HOSPITAL_BASED_OUTPATIENT_CLINIC_OR_DEPARTMENT_OTHER): Payer: BC Managed Care – PPO | Admitting: Physical Therapy

## 2021-07-02 DIAGNOSIS — M25511 Pain in right shoulder: Secondary | ICD-10-CM

## 2021-07-02 DIAGNOSIS — M25611 Stiffness of right shoulder, not elsewhere classified: Secondary | ICD-10-CM

## 2021-07-02 DIAGNOSIS — M6281 Muscle weakness (generalized): Secondary | ICD-10-CM

## 2021-07-02 DIAGNOSIS — G8929 Other chronic pain: Secondary | ICD-10-CM | POA: Diagnosis not present

## 2021-07-02 DIAGNOSIS — M25572 Pain in left ankle and joints of left foot: Secondary | ICD-10-CM | POA: Diagnosis not present

## 2021-07-02 DIAGNOSIS — M25561 Pain in right knee: Secondary | ICD-10-CM | POA: Diagnosis not present
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
## 2021-07-04 ENCOUNTER — Ambulatory Visit (HOSPITAL_BASED_OUTPATIENT_CLINIC_OR_DEPARTMENT_OTHER): Payer: BC Managed Care – PPO | Admitting: Physical Therapy

## 2021-07-04 ENCOUNTER — Ambulatory Visit
Admission: RE | Admit: 2021-07-04 | Discharge: 2021-07-04 | Disposition: A | Discharge status: Home / Self Care | Payer: BC Managed Care – PPO | Source: Ambulatory Visit | Attending: Orthopaedic Surgery | Admitting: Orthopaedic Surgery

## 2021-07-04 ENCOUNTER — Other Ambulatory Visit: Payer: BC Managed Care – PPO

## 2021-07-04 DIAGNOSIS — G8929 Other chronic pain: Secondary | ICD-10-CM

## 2021-07-04 DIAGNOSIS — M25561 Pain in right knee: Secondary | ICD-10-CM | POA: Diagnosis not present

## 2021-07-04 IMAGING — MR MR KNEE*R* W/O CM
7 series · 40 of 40 positions shown · non-contrast
Comparison: Radiographs dated [DATE]

CLINICAL DATA: Right knee pain for 2 years. 3 injections 9 2
maximum relief.

EXAM:
MRI OF THE RIGHT KNEE WITHOUT CONTRAST
TECHNIQUE: Multiplanar, multisequence MR imaging of the right was performed. No
intravenous contrast was administered.

[Series 6: T2 fat-sat · axial · right · 4.0mm · 0.50mm/px · z∈[-78,+76]mm · 6 of 36 slices shown (1 of 3)]
[im 1/36]
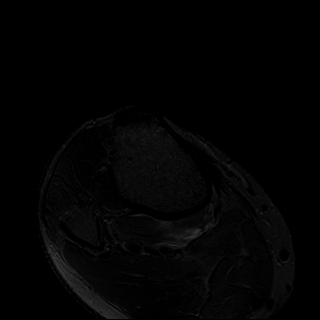
[im 8/36]
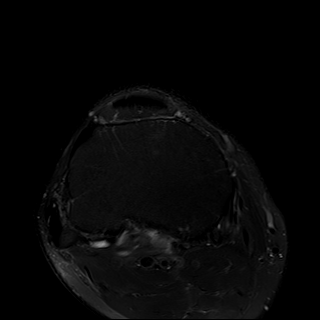
[im 15/36]
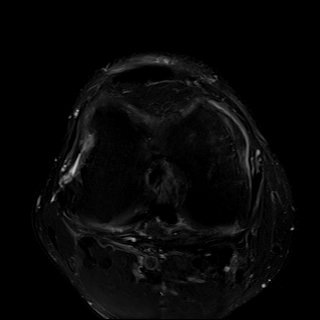
[im 22/36]
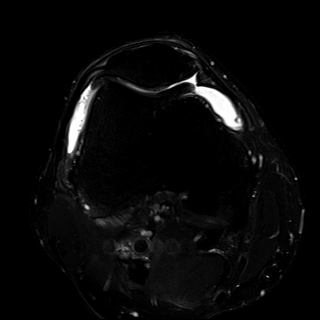
[im 29/36]
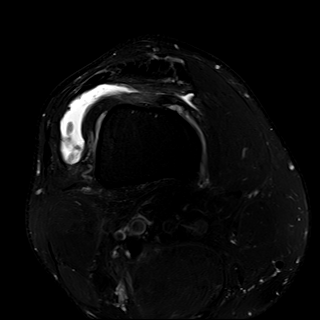
[im 36/36]
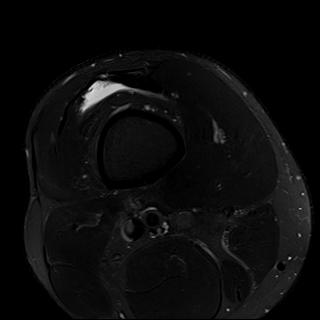

[Series 7: T2 fat-sat · coronal · right · 4.0mm · 0.47mm/px · 6 of 28 slices shown (2 of 3)]
[im 1/28]
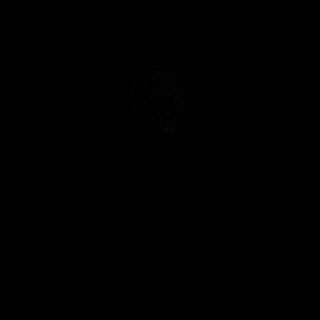
[im 6/28]
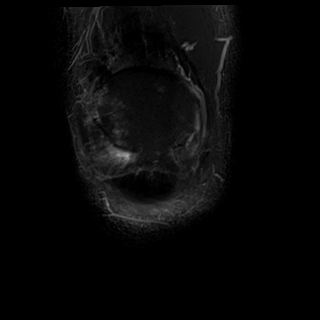
[im 11/28]
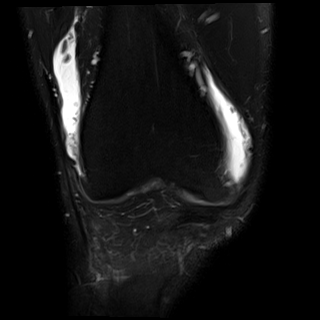
[im 17/28]
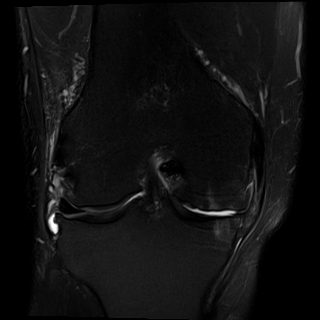
[im 22/28]
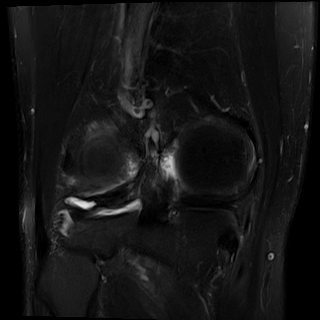
[im 28/28]
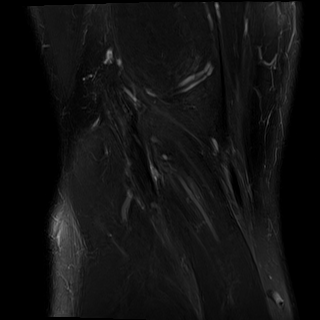

[Series 8: T1 · coronal · right · 4.0mm · 0.47mm/px · 6 of 28 slices shown]
[im 1/28]
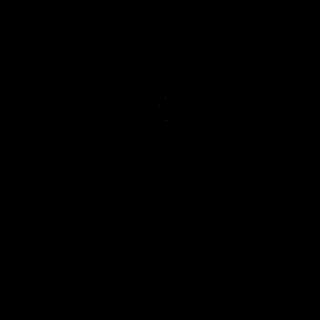
[im 6/28]
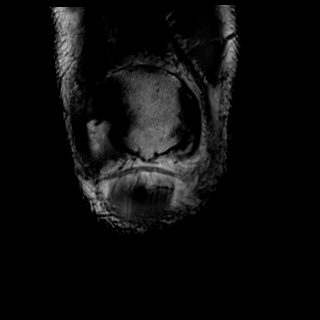
[im 11/28]
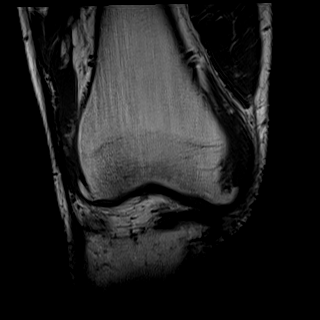
[im 17/28]
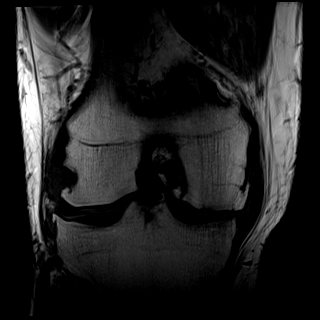
[im 22/28]
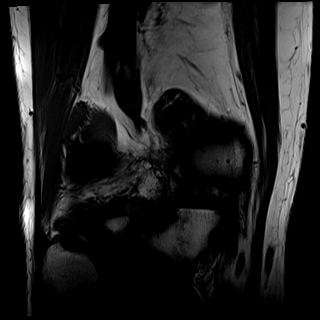
[im 28/28]
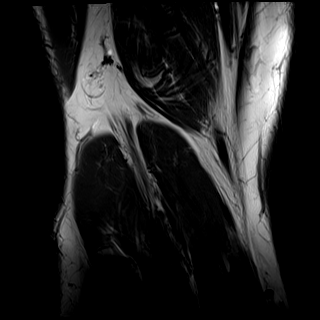

[Series 9: PD fat-sat · coronal · right · 3.0mm · 0.47mm/px · 6 of 28 slices shown (1 of 2)]
[im 1/28]
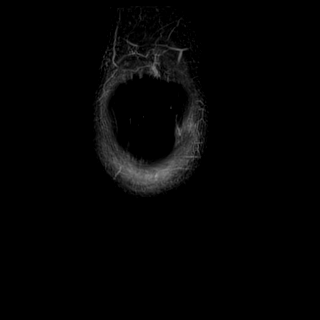
[im 6/28]
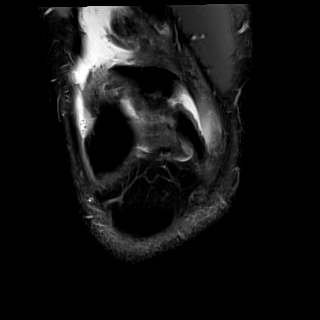
[im 11/28]
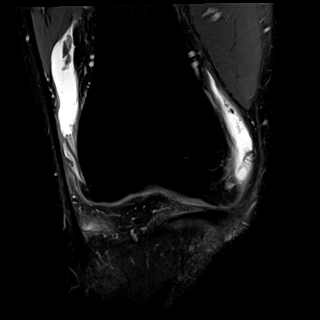
[im 17/28]
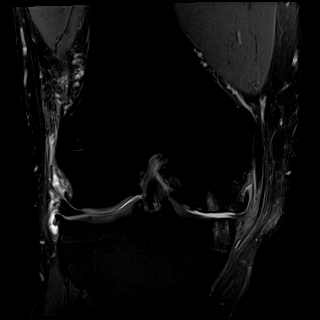
[im 22/28]
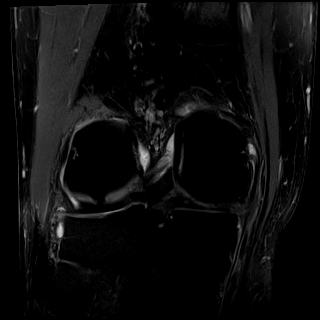
[im 28/28]
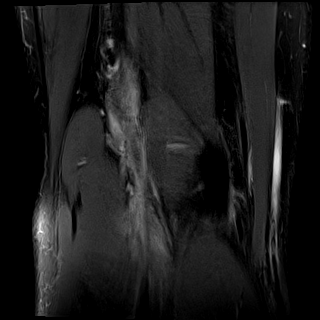

[Series 10: PD fat-sat · sagittal · right · 3.0mm · 0.39mm/px · 6 of 30 slices shown (2 of 2)]
[im 1/30]
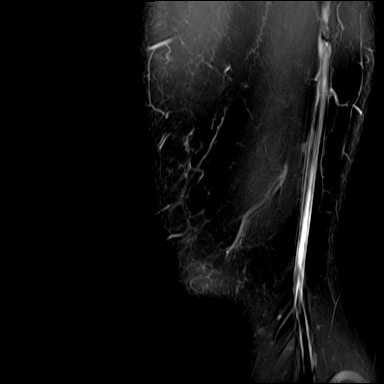
[im 6/30]
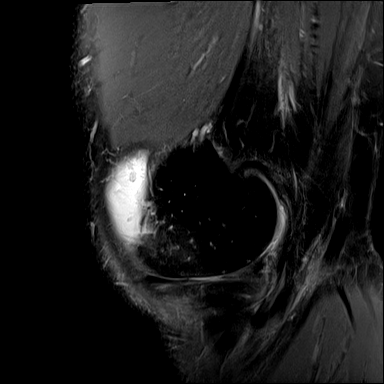
[im 12/30]
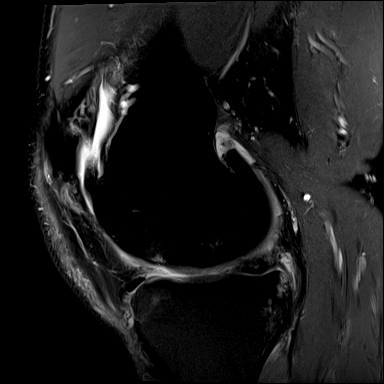
[im 18/30]
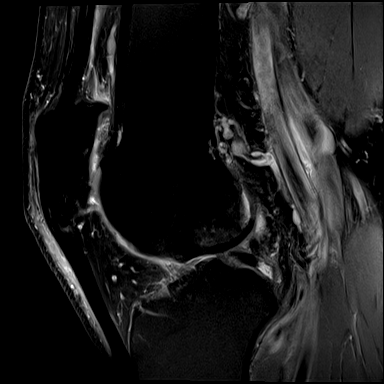
[im 24/30]
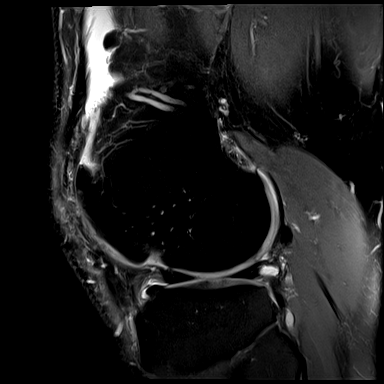
[im 30/30]
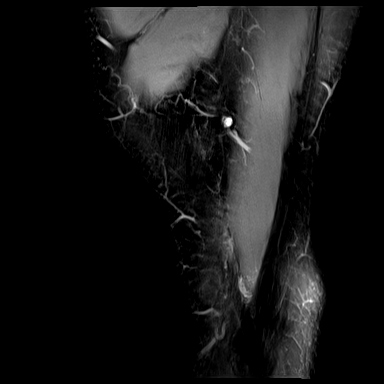

[Series 11: T2 fat-sat · sagittal · right · 3.0mm · 0.39mm/px · 6 of 30 slices shown (3 of 3)]
[im 1/30]
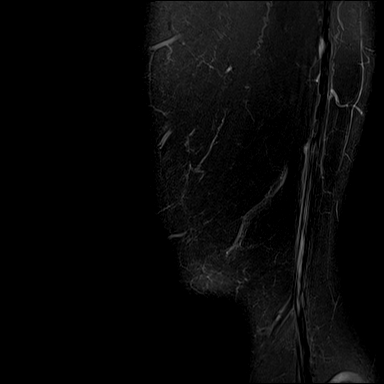
[im 6/30]
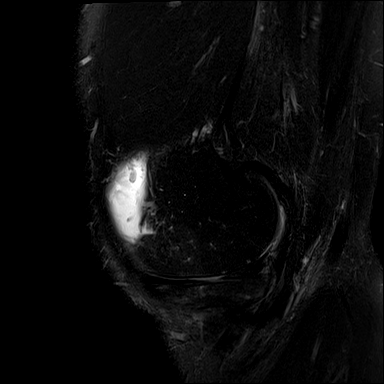
[im 12/30]
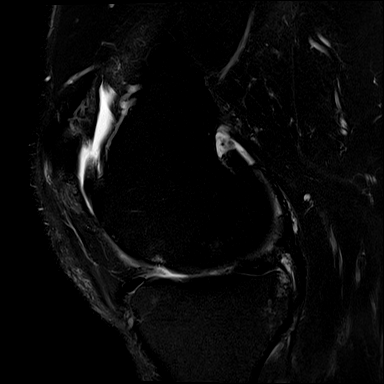
[im 18/30]
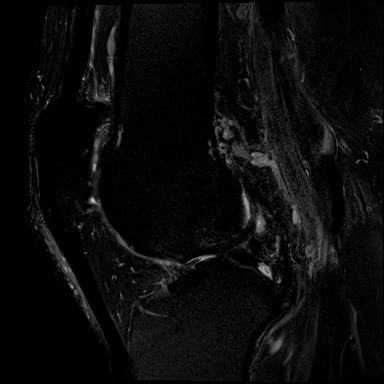
[im 24/30]
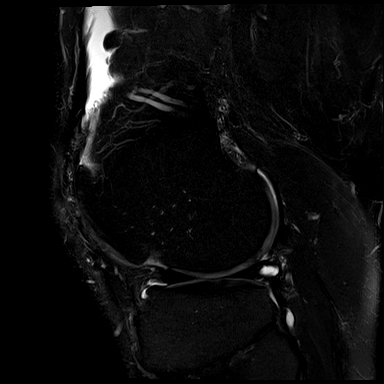
[im 30/30]
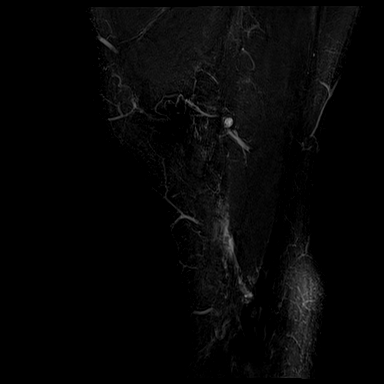

[Series 12: PD · oblique · right · 1.5mm · 0.44mm/px · 4 of 21 slices shown]
[im 1/21]
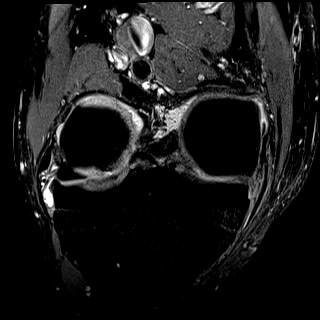
[im 7/21]
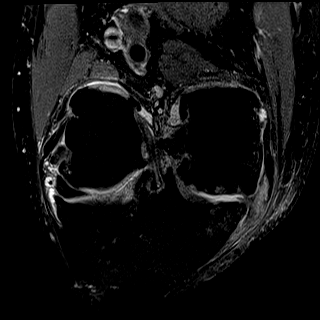
[im 14/21]
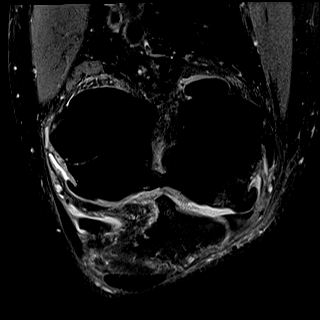
[im 21/21]
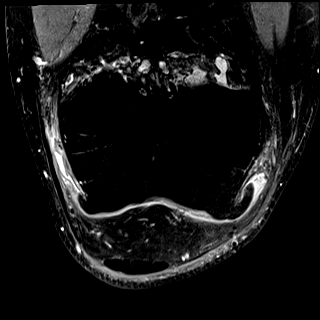

[40 of 40 positions shown; findings below may reference images not displayed]

FINDINGS: MENISCI

Medial: Complex tear of the body of the medial meniscus with
extrusion of the meniscus body in the medial gutter.

Lateral: Intact.

LIGAMENTS

Cruciates: ACL and PCL are intact.

Collaterals: Medial collateral ligament is intact. Lateral
collateral ligament complex is intact.

CARTILAGE

Patellofemoral: Full-thickness cartilage defect at the patellar
apex, lateral patellar facet and trochlea. Mild subchondral cystic
changes of the patella and trochlea.

Medial: Complete loss of the articular cartilage with mild
subchondral cystic changes and edema.

Lateral: Deep fissuring of the articular cartilage at the
weight-bearing area without full-thickness defect.

JOINT: Large joint effusion. Normal IBSA. Synovial
thickening consistent with moderate synovitis.

POPLITEAL FOSSA: Popliteus tendon is intact. No Baker's cyst.

EXTENSOR MECHANISM: Intact quadriceps tendon. Intact patellar
tendon. Intact lateral patellar retinaculum. Intact medial patellar
retinaculum. Intact MPFL.

BONES: No aggressive osseous lesion. No fracture or dislocation.
Mild subchondral edema of the medial femoral condyle anterior aspect
of the medial tibial plateau.

Other: No fluid collection or hematoma. Muscles are normal.
IMPRESSION: 1. Advanced medial tibiofemoral osteoarthritis with
degeneration/diminution of the medial meniscus, full-thickness
cartilage defect and mild subchondral cystic changes/edema of the
medial femoral condyle/medial tibial plateau. Large tricompartmental
marginal osteophytes.

2. Moderate patellofemoral osteoarthritis with full-thickness
cartilage defect subchondral edema of the patella and trochlea.
Marginal osteophytes

3.  Large joint effusion with synovitis.

4. Cruciate and collateral ligaments are intact. Quadriceps tendon
patellar tendon are intact. No evidence of fracture or
osteonecrosis.

## 2021-07-11 ENCOUNTER — Ambulatory Visit (HOSPITAL_BASED_OUTPATIENT_CLINIC_OR_DEPARTMENT_OTHER): Payer: BC Managed Care – PPO | Admitting: Orthopaedic Surgery

## 2021-07-11 DIAGNOSIS — M1711 Unilateral primary osteoarthritis, right knee: Secondary | ICD-10-CM

## 2021-07-11 NOTE — Progress Notes (Signed)
Post Operative Evaluation    Procedure/Date of Surgery: Shoulder rotator cuff repair done on April 03, 2021  Interval History:   Presents today for follow-up of his right shoulder rotator cuff repair.  Overall he is doing much better from this perspective.  He is now equal range of motion on the right shoulder compared to the contralateral side.  He is here today for further discussion of the right knee as well.  This continues to be painful although he is experiencing significant relief with his strengthening exercises.  He is now able to run approximately 3 to 4 miles with his brace.  He is here today for further assessment as he does continue to the pain which is limiting him in his ability to be active.   PMH/PSH/Family History/Social History/Meds/Allergies:    Past Medical History:  Diagnosis Date   Acute cartilage injury of knee    Arthritis    GERD (gastroesophageal reflux disease)    Past Surgical History:  Procedure Laterality Date   ELBOW SURGERY Left    KNEE ARTHROSCOPY Bilateral    KNEE SURGERY Bilateral    right thumb surgery     SHOULDER ARTHROSCOPY WITH ROTATOR CUFF REPAIR Right 04/03/2021   Procedure: Right SHOULDER ARTHROSCOPY WITH ROTATOR CUFF REPAIR;  Surgeon: Vanetta Mulders, MD;  Location: [place];  Service: Orthopedics;  Laterality: Right;   thumb surgery Right    Social History   Socioeconomic History   Marital status: Divorced    Spouse name: Not on file   Number of children: Not on file   Years of education: Not on file   Highest education level: Not on file  Occupational History   Not on file  Tobacco Use   Smoking status: Never   Smokeless tobacco: Never  Vaping Use   Vaping Use: Never used  Substance and Sexual Activity   Alcohol use: Yes    Comment: occassionally   Drug use: Never   Sexual activity: Yes    Partners: Female  Other Topics Concern   Not on file  Social History Narrative   Not on file    Social Determinants of Health   Financial Resource Strain: Not on file  Food Insecurity: Not on file  Transportation Needs: Not on file  Physical Activity: Not on file  Stress: Not on file  Social Connections: Not on file   Family History  Problem Relation Age of Onset   Diabetes Mother    Hypertension Mother    Asthma Mother    Cancer Father        bladder cancer   No Known Allergies Current Outpatient Medications  Medication Sig Dispense Refill   aspirin EC 325 MG tablet Take 1 tablet (325 mg total) by mouth daily. (Patient not taking: Reported on 03/20/2021) 30 tablet 0   benzonatate (TESSALON) 100 MG capsule Take 1-2 capsules (100-200 mg total) by mouth 3 (three) times daily as needed for cough. (Patient not taking: Reported on 03/20/2021) 60 capsule 0   cetirizine (ZYRTEC ALLERGY) 10 MG tablet Take 1 tablet (10 mg total) by mouth daily. (Patient not taking: Reported on 03/20/2021) 30 tablet 0   Esomeprazole Magnesium (NEXIUM PO) Take by mouth.     ondansetron (ZOFRAN-ODT) 8 MG disintegrating tablet Take 1 tablet (8 mg total) by mouth every 8 (  eight) hours as needed for nausea or vomiting. (Patient not taking: Reported on 04/07/2021) 10 tablet 0   oxyCODONE (OXY IR/ROXICODONE) 5 MG immediate release tablet Take 1 tablet (5 mg total) by mouth every 4 (four) hours as needed (severe pain). (Patient not taking: Reported on 03/20/2021) 20 tablet 0   promethazine-dextromethorphan (PROMETHAZINE-DM) 6.25-15 MG/5ML syrup Take 5 mLs by mouth at bedtime as needed for cough. (Patient not taking: Reported on 03/20/2021) 100 mL 0   pseudoephedrine (SUDAFED) 60 MG tablet Take 1 tablet (60 mg total) by mouth every 8 (eight) hours as needed for congestion. (Patient not taking: Reported on 03/20/2021) 30 tablet 0   No current facility-administered medications for this visit.   No results found.  Review of Systems:   A ROS was performed including pertinent positives and negatives as documented in the  HPI.   Musculoskeletal Exam:    There were no vitals taken for this visit.  Right shoulder incisions are well-healing.  Sensation is intact over the deltoid.  Able to fire and flex his elbow flexors and extensors.  Forward elevation actively is to 160 degrees, external rotation at side is to 70 degrees.  Fires wrist extensors and flexors.  2+ radial pulse  With regard to the right knee.  There is tenderness about the medial joint space.  Range of motion is from 0-135 with minimal crepitus.  There is no pain about the patellofemoral joint.  No varus or valgus laxity negative Lachman.  Imaging:    Right knee MRI: There is full-thickness cartilage loss involving the medial joint space.  The remaining portions of the joint do seem to be well preserved.  He appears to have essentially a total meniscectomy of the medial meniscus.  I personally reviewed and interpreted the radiographs.   Assessment:   12 status post right shoulder rotator cuff repair overall doing extremely well.   From this perspective we will continue to work increasing his strengthening of the right shoulder.  In terms of the right knee given the fact that his MRI does show post meniscectomy nearly complete cartilage loss involving the medial joint space I do believe that he may ultimately be a candidate for partial medial knee replacement.  To this effect I would like to refer him to Dr. Clayborne Dana for further discussion of this to see if he would be a candidate.  He has had multiple injections into his right knee with diminishing return as well as extensive physical therapy at this time and does not able to function at the level that he would like. Plan :    -Return to clinic in 3 months for reassessment of her right shoulder -Plan for referral to Dr. Clayborne Dana to see if he is a candidate for right partial knee replacement     I personally saw and evaluated the patient, and participated in the management and treatment  plan.  Vanetta Mulders, MD Attending Physician, Orthopedic Surgery  This document was dictated using Dragon voice recognition software. A reasonable attempt at proof reading has been made to minimize errors.

## 2021-07-17 DIAGNOSIS — M25561 Pain in right knee: Secondary | ICD-10-CM | POA: Diagnosis not present

## 2021-07-29 ENCOUNTER — Ambulatory Visit: Payer: BC Managed Care – PPO | Admitting: Internal Medicine

## 2021-10-10 ENCOUNTER — Ambulatory Visit (HOSPITAL_BASED_OUTPATIENT_CLINIC_OR_DEPARTMENT_OTHER): Payer: BC Managed Care – PPO | Admitting: Orthopaedic Surgery

## 2022-02-11 DIAGNOSIS — M9901 Segmental and somatic dysfunction of cervical region: Secondary | ICD-10-CM | POA: Diagnosis not present

## 2022-02-11 DIAGNOSIS — M542 Cervicalgia: Secondary | ICD-10-CM | POA: Diagnosis not present

## 2022-02-11 DIAGNOSIS — M5412 Radiculopathy, cervical region: Secondary | ICD-10-CM | POA: Diagnosis not present

## 2022-02-11 DIAGNOSIS — M25512 Pain in left shoulder: Secondary | ICD-10-CM | POA: Diagnosis not present

## 2022-02-19 DIAGNOSIS — M5412 Radiculopathy, cervical region: Secondary | ICD-10-CM | POA: Diagnosis not present

## 2022-02-19 DIAGNOSIS — M25512 Pain in left shoulder: Secondary | ICD-10-CM | POA: Diagnosis not present

## 2022-02-19 DIAGNOSIS — M9901 Segmental and somatic dysfunction of cervical region: Secondary | ICD-10-CM | POA: Diagnosis not present

## 2022-02-19 DIAGNOSIS — M542 Cervicalgia: Secondary | ICD-10-CM | POA: Diagnosis not present

## 2022-02-24 DIAGNOSIS — M25512 Pain in left shoulder: Secondary | ICD-10-CM | POA: Diagnosis not present

## 2022-02-24 DIAGNOSIS — M5412 Radiculopathy, cervical region: Secondary | ICD-10-CM | POA: Diagnosis not present

## 2022-02-24 DIAGNOSIS — M542 Cervicalgia: Secondary | ICD-10-CM | POA: Diagnosis not present

## 2022-02-24 DIAGNOSIS — M9901 Segmental and somatic dysfunction of cervical region: Secondary | ICD-10-CM | POA: Diagnosis not present

## 2022-02-26 DIAGNOSIS — M25512 Pain in left shoulder: Secondary | ICD-10-CM | POA: Diagnosis not present

## 2022-02-26 DIAGNOSIS — M5412 Radiculopathy, cervical region: Secondary | ICD-10-CM | POA: Diagnosis not present

## 2022-02-26 DIAGNOSIS — M9901 Segmental and somatic dysfunction of cervical region: Secondary | ICD-10-CM | POA: Diagnosis not present

## 2022-02-26 DIAGNOSIS — M542 Cervicalgia: Secondary | ICD-10-CM | POA: Diagnosis not present

## 2022-03-03 DIAGNOSIS — M25512 Pain in left shoulder: Secondary | ICD-10-CM | POA: Diagnosis not present

## 2022-03-03 DIAGNOSIS — M542 Cervicalgia: Secondary | ICD-10-CM | POA: Diagnosis not present

## 2022-03-03 DIAGNOSIS — M9901 Segmental and somatic dysfunction of cervical region: Secondary | ICD-10-CM | POA: Diagnosis not present

## 2022-03-03 DIAGNOSIS — M5412 Radiculopathy, cervical region: Secondary | ICD-10-CM | POA: Diagnosis not present

## 2022-03-05 DIAGNOSIS — M25512 Pain in left shoulder: Secondary | ICD-10-CM | POA: Diagnosis not present

## 2022-03-05 DIAGNOSIS — M9901 Segmental and somatic dysfunction of cervical region: Secondary | ICD-10-CM | POA: Diagnosis not present

## 2022-03-05 DIAGNOSIS — M5412 Radiculopathy, cervical region: Secondary | ICD-10-CM | POA: Diagnosis not present

## 2022-03-05 DIAGNOSIS — M542 Cervicalgia: Secondary | ICD-10-CM | POA: Diagnosis not present

## 2022-03-10 DIAGNOSIS — M542 Cervicalgia: Secondary | ICD-10-CM | POA: Diagnosis not present

## 2022-03-10 DIAGNOSIS — M5412 Radiculopathy, cervical region: Secondary | ICD-10-CM | POA: Diagnosis not present

## 2022-03-10 DIAGNOSIS — M25512 Pain in left shoulder: Secondary | ICD-10-CM | POA: Diagnosis not present

## 2022-03-10 DIAGNOSIS — M9901 Segmental and somatic dysfunction of cervical region: Secondary | ICD-10-CM | POA: Diagnosis not present

## 2022-03-12 DIAGNOSIS — M25512 Pain in left shoulder: Secondary | ICD-10-CM | POA: Diagnosis not present

## 2022-03-12 DIAGNOSIS — M542 Cervicalgia: Secondary | ICD-10-CM | POA: Diagnosis not present

## 2022-03-12 DIAGNOSIS — M5412 Radiculopathy, cervical region: Secondary | ICD-10-CM | POA: Diagnosis not present

## 2022-03-12 DIAGNOSIS — M9901 Segmental and somatic dysfunction of cervical region: Secondary | ICD-10-CM | POA: Diagnosis not present

## 2022-03-17 DIAGNOSIS — M5412 Radiculopathy, cervical region: Secondary | ICD-10-CM | POA: Diagnosis not present

## 2022-03-17 DIAGNOSIS — M542 Cervicalgia: Secondary | ICD-10-CM | POA: Diagnosis not present

## 2022-03-17 DIAGNOSIS — M25512 Pain in left shoulder: Secondary | ICD-10-CM | POA: Diagnosis not present

## 2022-03-17 DIAGNOSIS — M9901 Segmental and somatic dysfunction of cervical region: Secondary | ICD-10-CM | POA: Diagnosis not present

## 2022-03-19 DIAGNOSIS — M9901 Segmental and somatic dysfunction of cervical region: Secondary | ICD-10-CM | POA: Diagnosis not present

## 2022-03-19 DIAGNOSIS — M25512 Pain in left shoulder: Secondary | ICD-10-CM | POA: Diagnosis not present

## 2022-03-19 DIAGNOSIS — M5412 Radiculopathy, cervical region: Secondary | ICD-10-CM | POA: Diagnosis not present

## 2022-03-19 DIAGNOSIS — M542 Cervicalgia: Secondary | ICD-10-CM | POA: Diagnosis not present

## 2022-03-24 DIAGNOSIS — M5412 Radiculopathy, cervical region: Secondary | ICD-10-CM | POA: Diagnosis not present

## 2022-03-24 DIAGNOSIS — M542 Cervicalgia: Secondary | ICD-10-CM | POA: Diagnosis not present

## 2022-03-24 DIAGNOSIS — M9901 Segmental and somatic dysfunction of cervical region: Secondary | ICD-10-CM | POA: Diagnosis not present

## 2022-03-24 DIAGNOSIS — M25512 Pain in left shoulder: Secondary | ICD-10-CM | POA: Diagnosis not present

## 2022-03-26 DIAGNOSIS — M5412 Radiculopathy, cervical region: Secondary | ICD-10-CM | POA: Diagnosis not present

## 2022-03-26 DIAGNOSIS — M9901 Segmental and somatic dysfunction of cervical region: Secondary | ICD-10-CM | POA: Diagnosis not present

## 2022-03-26 DIAGNOSIS — M542 Cervicalgia: Secondary | ICD-10-CM | POA: Diagnosis not present

## 2022-03-26 DIAGNOSIS — M25512 Pain in left shoulder: Secondary | ICD-10-CM | POA: Diagnosis not present

## 2022-03-31 DIAGNOSIS — M542 Cervicalgia: Secondary | ICD-10-CM | POA: Diagnosis not present

## 2022-03-31 DIAGNOSIS — M9901 Segmental and somatic dysfunction of cervical region: Secondary | ICD-10-CM | POA: Diagnosis not present

## 2022-03-31 DIAGNOSIS — M25512 Pain in left shoulder: Secondary | ICD-10-CM | POA: Diagnosis not present

## 2022-03-31 DIAGNOSIS — M5412 Radiculopathy, cervical region: Secondary | ICD-10-CM | POA: Diagnosis not present

## 2022-04-02 DIAGNOSIS — M9901 Segmental and somatic dysfunction of cervical region: Secondary | ICD-10-CM | POA: Diagnosis not present

## 2022-04-02 DIAGNOSIS — M5412 Radiculopathy, cervical region: Secondary | ICD-10-CM | POA: Diagnosis not present

## 2022-04-02 DIAGNOSIS — M25512 Pain in left shoulder: Secondary | ICD-10-CM | POA: Diagnosis not present

## 2022-04-02 DIAGNOSIS — M542 Cervicalgia: Secondary | ICD-10-CM | POA: Diagnosis not present

## 2022-04-07 DIAGNOSIS — M5412 Radiculopathy, cervical region: Secondary | ICD-10-CM | POA: Diagnosis not present

## 2022-04-07 DIAGNOSIS — M25512 Pain in left shoulder: Secondary | ICD-10-CM | POA: Diagnosis not present

## 2022-04-07 DIAGNOSIS — M9901 Segmental and somatic dysfunction of cervical region: Secondary | ICD-10-CM | POA: Diagnosis not present

## 2022-04-07 DIAGNOSIS — M542 Cervicalgia: Secondary | ICD-10-CM | POA: Diagnosis not present

## 2022-04-09 DIAGNOSIS — M5412 Radiculopathy, cervical region: Secondary | ICD-10-CM | POA: Diagnosis not present

## 2022-04-09 DIAGNOSIS — M9901 Segmental and somatic dysfunction of cervical region: Secondary | ICD-10-CM | POA: Diagnosis not present

## 2022-04-09 DIAGNOSIS — M25512 Pain in left shoulder: Secondary | ICD-10-CM | POA: Diagnosis not present

## 2022-04-09 DIAGNOSIS — M542 Cervicalgia: Secondary | ICD-10-CM | POA: Diagnosis not present

## 2022-05-28 DIAGNOSIS — M5412 Radiculopathy, cervical region: Secondary | ICD-10-CM | POA: Diagnosis not present

## 2022-05-28 DIAGNOSIS — M9901 Segmental and somatic dysfunction of cervical region: Secondary | ICD-10-CM | POA: Diagnosis not present

## 2022-05-28 DIAGNOSIS — M25512 Pain in left shoulder: Secondary | ICD-10-CM | POA: Diagnosis not present

## 2022-05-28 DIAGNOSIS — M542 Cervicalgia: Secondary | ICD-10-CM | POA: Diagnosis not present

## 2022-06-02 DIAGNOSIS — M542 Cervicalgia: Secondary | ICD-10-CM | POA: Diagnosis not present

## 2022-06-02 DIAGNOSIS — M5412 Radiculopathy, cervical region: Secondary | ICD-10-CM | POA: Diagnosis not present

## 2022-06-02 DIAGNOSIS — M9901 Segmental and somatic dysfunction of cervical region: Secondary | ICD-10-CM | POA: Diagnosis not present

## 2022-06-02 DIAGNOSIS — M25512 Pain in left shoulder: Secondary | ICD-10-CM | POA: Diagnosis not present

## 2022-06-04 DIAGNOSIS — M9901 Segmental and somatic dysfunction of cervical region: Secondary | ICD-10-CM | POA: Diagnosis not present

## 2022-06-04 DIAGNOSIS — M542 Cervicalgia: Secondary | ICD-10-CM | POA: Diagnosis not present

## 2022-06-04 DIAGNOSIS — M5412 Radiculopathy, cervical region: Secondary | ICD-10-CM | POA: Diagnosis not present

## 2022-06-04 DIAGNOSIS — M25512 Pain in left shoulder: Secondary | ICD-10-CM | POA: Diagnosis not present

## 2022-06-16 DIAGNOSIS — M5412 Radiculopathy, cervical region: Secondary | ICD-10-CM | POA: Diagnosis not present

## 2022-06-16 DIAGNOSIS — M9901 Segmental and somatic dysfunction of cervical region: Secondary | ICD-10-CM | POA: Diagnosis not present

## 2022-06-16 DIAGNOSIS — M25512 Pain in left shoulder: Secondary | ICD-10-CM | POA: Diagnosis not present

## 2022-06-16 DIAGNOSIS — M542 Cervicalgia: Secondary | ICD-10-CM | POA: Diagnosis not present

## 2022-07-28 DIAGNOSIS — M25551 Pain in right hip: Secondary | ICD-10-CM | POA: Diagnosis not present

## 2022-07-28 DIAGNOSIS — M5412 Radiculopathy, cervical region: Secondary | ICD-10-CM | POA: Diagnosis not present

## 2022-07-28 DIAGNOSIS — M25571 Pain in right ankle and joints of right foot: Secondary | ICD-10-CM | POA: Diagnosis not present

## 2022-07-28 DIAGNOSIS — M542 Cervicalgia: Secondary | ICD-10-CM | POA: Diagnosis not present

## 2022-07-28 DIAGNOSIS — M25512 Pain in left shoulder: Secondary | ICD-10-CM | POA: Diagnosis not present

## 2022-07-28 DIAGNOSIS — M25562 Pain in left knee: Secondary | ICD-10-CM | POA: Diagnosis not present

## 2022-07-28 DIAGNOSIS — M9905 Segmental and somatic dysfunction of pelvic region: Secondary | ICD-10-CM | POA: Diagnosis not present

## 2022-07-28 DIAGNOSIS — M25572 Pain in left ankle and joints of left foot: Secondary | ICD-10-CM | POA: Diagnosis not present

## 2022-07-28 DIAGNOSIS — M9901 Segmental and somatic dysfunction of cervical region: Secondary | ICD-10-CM | POA: Diagnosis not present

## 2022-07-28 DIAGNOSIS — M25552 Pain in left hip: Secondary | ICD-10-CM | POA: Diagnosis not present

## 2022-07-28 DIAGNOSIS — M25561 Pain in right knee: Secondary | ICD-10-CM | POA: Diagnosis not present

## 2023-03-01 ENCOUNTER — Telehealth: Payer: Self-pay | Admitting: Orthopaedic Surgery

## 2023-03-01 NOTE — Telephone Encounter (Signed)
Patient called advised he has moved to [facility] and will need knee replacement surgery. Patient wanted to know if Dr. Steward Drone could recommend someone to do his surgery.  The number to contact patient is [phone]

## 2023-03-02 ENCOUNTER — Encounter: Payer: Self-pay | Admitting: Orthopaedic Surgery

## 2023-12-06 ENCOUNTER — Encounter: Payer: Self-pay | Admitting: Radiology
